# Patient Record
Sex: Female | Born: 1975
Health system: Southern US, Community
[De-identification: ages and names within clinical notes are randomized; demographics above are authoritative.]

## PROBLEM LIST (undated history)

## (undated) DIAGNOSIS — E559 Vitamin D deficiency, unspecified: Secondary | ICD-10-CM

## (undated) DIAGNOSIS — F418 Other specified anxiety disorders: Secondary | ICD-10-CM

## (undated) HISTORY — DX: Other specified anxiety disorders: F41.8

---
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
## 2004-08-26 ENCOUNTER — Inpatient Hospital Stay (HOSPITAL_COMMUNITY): Admission: AD | Admit: 2004-08-26 | Discharge: 2004-08-28 | Payer: Self-pay | Admitting: Obstetrics

## 2004-12-07 ENCOUNTER — Emergency Department (HOSPITAL_COMMUNITY): Admission: EM | Admit: 2004-12-07 | Discharge: 2004-12-07 | Payer: Self-pay | Admitting: Emergency Medicine

## 2005-02-27 ENCOUNTER — Emergency Department (HOSPITAL_COMMUNITY): Admission: EM | Admit: 2005-02-27 | Discharge: 2005-02-27 | Payer: Self-pay | Admitting: Emergency Medicine

## 2005-03-05 ENCOUNTER — Emergency Department (HOSPITAL_COMMUNITY): Admission: EM | Admit: 2005-03-05 | Discharge: 2005-03-05 | Payer: Self-pay | Admitting: Emergency Medicine

## 2007-04-16 ENCOUNTER — Emergency Department (HOSPITAL_COMMUNITY): Admission: EM | Admit: 2007-04-16 | Discharge: 2007-04-16 | Payer: Self-pay | Admitting: Emergency Medicine

## 2008-11-05 ENCOUNTER — Inpatient Hospital Stay (HOSPITAL_COMMUNITY): Admission: AD | Admit: 2008-11-05 | Discharge: 2008-11-05 | Payer: Self-pay | Admitting: Obstetrics & Gynecology

## 2009-01-31 ENCOUNTER — Ambulatory Visit (HOSPITAL_COMMUNITY): Admission: RE | Admit: 2009-01-31 | Discharge: 2009-01-31 | Payer: Self-pay | Admitting: Obstetrics & Gynecology

## 2009-06-06 ENCOUNTER — Inpatient Hospital Stay (HOSPITAL_COMMUNITY): Admission: AD | Admit: 2009-06-06 | Discharge: 2009-06-08 | Payer: Self-pay | Admitting: Obstetrics & Gynecology

## 2009-06-06 ENCOUNTER — Ambulatory Visit: Payer: Self-pay | Admitting: Physician Assistant

## 2010-02-01 ENCOUNTER — Encounter: Payer: Self-pay | Admitting: Emergency Medicine

## 2010-03-30 LAB — CBC
HCT: 32 % — ABNORMAL LOW (ref 36.0–46.0)
HCT: 33.5 % — ABNORMAL LOW (ref 36.0–46.0)
Hemoglobin: 11.2 g/dL — ABNORMAL LOW (ref 12.0–15.0)
Hemoglobin: 11.9 g/dL — ABNORMAL LOW (ref 12.0–15.0)
MCHC: 34.9 g/dL (ref 30.0–36.0)
MCHC: 35.4 g/dL (ref 30.0–36.0)
MCV: 98.5 fL (ref 78.0–100.0)
MCV: 99.3 fL (ref 78.0–100.0)
Platelets: 106 10*3/uL — ABNORMAL LOW (ref 150–400)
Platelets: 111 10*3/uL — ABNORMAL LOW (ref 150–400)
RBC: 3.23 MIL/uL — ABNORMAL LOW (ref 3.87–5.11)
RBC: 3.4 MIL/uL — ABNORMAL LOW (ref 3.87–5.11)
RDW: 14.3 % (ref 11.5–15.5)
RDW: 14.5 % (ref 11.5–15.5)
WBC: 11.3 10*3/uL — ABNORMAL HIGH (ref 4.0–10.5)
WBC: 6.1 10*3/uL (ref 4.0–10.5)

## 2010-03-30 LAB — RPR: RPR Ser Ql: NONREACTIVE

## 2010-04-16 LAB — GC/CHLAMYDIA PROBE AMP, GENITAL
Chlamydia, DNA Probe: NEGATIVE
GC Probe Amp, Genital: NEGATIVE

## 2010-04-16 LAB — CBC
HCT: 32.5 % — ABNORMAL LOW (ref 36.0–46.0)
Hemoglobin: 11.2 g/dL — ABNORMAL LOW (ref 12.0–15.0)
MCHC: 34.5 g/dL (ref 30.0–36.0)
MCV: 94.6 fL (ref 78.0–100.0)
Platelets: 220 10*3/uL (ref 150–400)
RBC: 3.44 MIL/uL — ABNORMAL LOW (ref 3.87–5.11)
RDW: 12.4 % (ref 11.5–15.5)
WBC: 5.6 10*3/uL (ref 4.0–10.5)

## 2010-04-16 LAB — ABO/RH: ABO/RH(D): O POS

## 2010-04-16 LAB — HCG, QUANTITATIVE, PREGNANCY: hCG, Beta Chain, Quant, S: 213637 m[IU]/mL — ABNORMAL HIGH (ref <5)

## 2010-04-16 LAB — WET PREP, GENITAL
Clue Cells Wet Prep HPF POC: NONE SEEN
Trich, Wet Prep: NONE SEEN
Yeast Wet Prep HPF POC: NONE SEEN

## 2010-10-06 LAB — URINE MICROSCOPIC-ADD ON

## 2010-10-06 LAB — COMPREHENSIVE METABOLIC PANEL
ALT: 11
AST: 22
Albumin: 3.3 — ABNORMAL LOW
Alkaline Phosphatase: 48
BUN: 10
CO2: 20
Calcium: 8.7
Chloride: 103
Creatinine, Ser: 0.8
GFR calc Af Amer: >60
GFR calc non Af Amer: >60
Glucose, Bld: 170 — ABNORMAL HIGH
Potassium: 2.8 — ABNORMAL LOW
Sodium: 135
Total Bilirubin: 0.3
Total Protein: 6.5

## 2010-10-06 LAB — URINALYSIS, ROUTINE W REFLEX MICROSCOPIC
Bilirubin Urine: NEGATIVE
Glucose, UA: NEGATIVE
Ketones, ur: 15 — AB
Nitrite: POSITIVE — AB
Protein, ur: NEGATIVE
Specific Gravity, Urine: 1.009
Urobilinogen, UA: 0.2
pH: 6

## 2010-10-06 LAB — CBC
HCT: 32.3 — ABNORMAL LOW
Hemoglobin: 11.4 — ABNORMAL LOW
MCHC: 35.3
MCV: 92.5
Platelets: 145 — ABNORMAL LOW
RBC: 3.49 — ABNORMAL LOW
RDW: 12.6
WBC: 10.1

## 2010-10-06 LAB — DIFFERENTIAL
Basophils Absolute: 0
Basophils Relative: 0
Eosinophils Absolute: 0
Eosinophils Relative: 0
Lymphocytes Relative: 3 — ABNORMAL LOW
Lymphs Abs: 0.3 — ABNORMAL LOW
Monocytes Absolute: 0.5
Monocytes Relative: 5
Neutro Abs: 9.4 — ABNORMAL HIGH
Neutrophils Relative %: 93 — ABNORMAL HIGH

## 2010-10-06 LAB — POCT PREGNANCY, URINE
Operator id: 26520
Preg Test, Ur: NEGATIVE

## 2010-10-06 LAB — URINE CULTURE: Colony Count: >=100000

## 2011-11-20 ENCOUNTER — Emergency Department (HOSPITAL_COMMUNITY)
Admission: EM | Admit: 2011-11-20 | Discharge: 2011-11-21 | Disposition: A | Discharge status: Home / Self Care | Payer: Self-pay | Attending: Emergency Medicine | Admitting: Emergency Medicine

## 2011-11-20 ENCOUNTER — Encounter (HOSPITAL_COMMUNITY): Payer: Self-pay

## 2011-11-20 DIAGNOSIS — R509 Fever, unspecified: Secondary | ICD-10-CM | POA: Insufficient documentation

## 2011-11-20 DIAGNOSIS — N12 Tubulo-interstitial nephritis, not specified as acute or chronic: Secondary | ICD-10-CM | POA: Insufficient documentation

## 2011-11-20 DIAGNOSIS — R110 Nausea: Secondary | ICD-10-CM | POA: Insufficient documentation

## 2011-11-20 MED ORDER — ACETAMINOPHEN 325 MG PO TABS: 650.0000 mg | ORAL_TABLET | Freq: Once | ORAL | Status: DC

## 2011-11-20 MED ORDER — ONDANSETRON HCL 4 MG/2ML IJ SOLN
4.0000 mg | Freq: Once | INTRAMUSCULAR | Status: AC
  Administered 2011-11-20: 4 mg via INTRAVENOUS
  Filled 2011-11-20: qty 2

## 2011-11-20 MED ORDER — SODIUM CHLORIDE 0.9 % IV BOLUS (SEPSIS)
1000.0000 mL | Freq: Once | INTRAVENOUS | Status: AC
  Administered 2011-11-20: 1000 mL via INTRAVENOUS

## 2011-11-20 MED ORDER — IBUPROFEN 800 MG PO TABS
800.0000 mg | ORAL_TABLET | Freq: Once | ORAL | Status: AC
  Administered 2011-11-20: 800 mg via ORAL
  Filled 2011-11-20: qty 1

## 2011-11-20 MED ORDER — FENTANYL CITRATE 0.05 MG/ML IJ SOLN
50.0000 ug | Freq: Once | INTRAMUSCULAR | Status: AC
  Administered 2011-11-20: 50 ug via INTRAVENOUS
  Filled 2011-11-20: qty 2

## 2011-11-20 NOTE — ED Notes (Signed)
Per Spouse, pt started with left flank pain yesterday.  Today having fever and chills. No trouble urinating.  Pt is nauseated with no vomiting.  No burning with urination.

## 2011-11-21 LAB — CBC WITH DIFFERENTIAL/PLATELET
Basophils Absolute: 0 10*3/uL (ref 0.0–0.1)
Basophils Relative: 0 % (ref 0–1)
Eosinophils Absolute: 0 10*3/uL (ref 0.0–0.7)
MCH: 30.7 pg (ref 26.0–34.0)
MCHC: 34.7 g/dL (ref 30.0–36.0)
Neutrophils Relative %: 95 % — ABNORMAL HIGH (ref 43–77)
Platelets: 153 10*3/uL (ref 150–400)
RBC: 3.68 MIL/uL — ABNORMAL LOW (ref 3.87–5.11)
RDW: 12.6 % (ref 11.5–15.5)

## 2011-11-21 LAB — URINALYSIS, ROUTINE W REFLEX MICROSCOPIC
Bilirubin Urine: NEGATIVE
Ketones, ur: NEGATIVE mg/dL
Nitrite: POSITIVE — AB
Protein, ur: 100 mg/dL — AB
Specific Gravity, Urine: 1.02 (ref 1.005–1.030)
Urobilinogen, UA: 1 mg/dL (ref 0.0–1.0)

## 2011-11-21 LAB — BASIC METABOLIC PANEL
GFR calc Af Amer: >90 mL/min (ref >90)
GFR calc non Af Amer: >90 mL/min (ref >90)
Potassium: 3.1 mEq/L — ABNORMAL LOW (ref 3.5–5.1)
Sodium: 136 mEq/L (ref 135–145)

## 2011-11-21 LAB — URINE MICROSCOPIC-ADD ON

## 2011-11-21 MED ORDER — OXYCODONE-ACETAMINOPHEN 5-325 MG PO TABS
2.0000 | ORAL_TABLET | Freq: Once | ORAL | Status: AC
  Administered 2011-11-21: 2 via ORAL
  Filled 2011-11-21: qty 2

## 2011-11-21 MED ORDER — ONDANSETRON HCL 4 MG PO TABS: 4.0000 mg | ORAL_TABLET | Freq: Four times a day (QID) | ORAL | Status: DC

## 2011-11-21 MED ORDER — CIPROFLOXACIN HCL 500 MG PO TABS
500.0000 mg | ORAL_TABLET | Freq: Two times a day (BID) | ORAL | Status: DC
Start: 2011-11-21 — End: 2012-10-02

## 2011-11-21 MED ORDER — DEXTROSE 5 % IV SOLN
1.0000 g | Freq: Once | INTRAVENOUS | Status: AC
  Administered 2011-11-21: 1 g via INTRAVENOUS
  Filled 2011-11-21: qty 10

## 2011-11-21 MED ORDER — IBUPROFEN 800 MG PO TABS: 800.0000 mg | ORAL_TABLET | Freq: Three times a day (TID) | ORAL | Status: DC

## 2011-11-21 MED ORDER — CIPROFLOXACIN IN D5W 400 MG/200ML IV SOLN
400.0000 mg | Freq: Two times a day (BID) | INTRAVENOUS | Status: DC
  Administered 2011-11-21: 400 mg via INTRAVENOUS
  Filled 2011-11-21: qty 200

## 2011-11-21 MED ORDER — POTASSIUM CHLORIDE CRYS ER 20 MEQ PO TBCR
40.0000 meq | EXTENDED_RELEASE_TABLET | Freq: Once | ORAL | Status: AC
  Administered 2011-11-21: 40 meq via ORAL
  Filled 2011-11-21: qty 2

## 2011-11-21 NOTE — ED Provider Notes (Signed)
History     CSN: [id number]  Arrival date & time 11/20/11  2248   First MD Initiated Contact with Patient 11/20/11 2305      Chief Complaint  Patient presents with  . Flank Pain    (Consider location/radiation/quality/duration/timing/severity/associated sxs/prior treatment) HPI 36 year old Hispanic female presents to emergency apartment complaining of left flank pain, fever, nausea without vomiting. Patient hasn't reported onset of symptoms yesterday. She denies any urinary symptoms, no vaginal discharge. Last cycle was 2 weeks ago. No prior history of bladder infection pyelonephritis or kidney stone. She's had no vomiting. No sick contacts. No rashes, no injury.  No past medical history on file.  No past surgical history on file.  No family history on file.  History  Substance Use Topics  . Smoking status: Never Smoker   . Smokeless tobacco: Not on file  . Alcohol Use: Yes     Comment: social    OB History    Grav Para Term Preterm Abortions TAB SAB Ect Mult Living                  Review of Systems  Unable to perform ROS: Other   language barrier  Allergies  Review of patient's allergies indicates no known allergies.  Home Medications   Current Outpatient Rx  Name  Route  Sig  Dispense  Refill  . ACETAMINOPHEN 325 MG PO TABS   Oral   Take 650 mg by mouth every 6 (six) hours as needed. For pain         . CIPROFLOXACIN HCL 500 MG PO TABS   Oral   Take 1 tablet (500 mg total) by mouth every 12 (twelve) hours.   14 tablet   0   . IBUPROFEN 800 MG PO TABS   Oral   Take 1 tablet (800 mg total) by mouth 3 (three) times daily.   21 tablet   0   . ONDANSETRON HCL 4 MG PO TABS   Oral   Take 1 tablet (4 mg total) by mouth every 6 (six) hours. PRN nausea   12 tablet   0     BP 98/48  Pulse 92  Temp 98.2 F (36.8 C) (Oral)  Resp 16  SpO2 100%  LMP 11/07/2011  Physical Exam  Nursing note and vitals reviewed. Constitutional: She is oriented to  person, place, and time. She appears well-developed and well-nourished. She appears distressed (ill-appearing, uncomfortable).  HENT:  Head: Normocephalic and atraumatic.  Nose: Nose normal.  Mouth/Throat: Oropharynx is clear and moist.  Eyes: Conjunctivae normal and EOM are normal. Pupils are equal, round, and reactive to light.  Neck: Normal range of motion. Neck supple. No JVD present. No tracheal deviation present. No thyromegaly present.  Cardiovascular: Normal rate, regular rhythm, normal heart sounds and intact distal pulses.  Exam reveals no gallop and no friction rub.   No murmur heard. Pulmonary/Chest: Effort normal and breath sounds normal. No stridor. No respiratory distress. She has no wheezes. She has no rales. She exhibits no tenderness.  Abdominal: Soft. Bowel sounds are normal. She exhibits no distension and no mass. There is tenderness (tenderness in epigastrium and left CVA area). There is no rebound and no guarding.  Musculoskeletal: Normal range of motion. She exhibits no edema and no tenderness.  Lymphadenopathy:    She has no cervical adenopathy.  Neurological: She is alert and oriented to person, place, and time. She exhibits normal muscle tone. Coordination normal.  Skin: Skin is warm  and dry. No rash noted. No erythema. No pallor.  Psychiatric: She has a normal mood and affect. Her behavior is normal. Judgment and thought content normal.    ED Course  Procedures (including critical care time)  Labs Reviewed  URINALYSIS, ROUTINE W REFLEX MICROSCOPIC - Abnormal; Notable for the following:    APPearance CLOUDY (*)     Hgb urine dipstick LARGE (*)     Protein, ur 100 (*)     Nitrite POSITIVE (*)     Leukocytes, UA LARGE (*)     All other components within normal limits  CBC WITH DIFFERENTIAL - Abnormal; Notable for the following:    RBC 3.68 (*)     Hemoglobin 11.3 (*)     HCT 32.6 (*)     Neutrophils Relative 95 (*)     Lymphocytes Relative 4 (*)     Lymphs  Abs 0.2 (*)     Monocytes Relative 0 (*)     Monocytes Absolute 0.0 (*)     All other components within normal limits  BASIC METABOLIC PANEL - Abnormal; Notable for the following:    Potassium 3.1 (*)     Glucose, Bld 113 (*)     All other components within normal limits  URINE MICROSCOPIC-ADD ON - Abnormal; Notable for the following:    Bacteria, UA MANY (*)     All other components within normal limits  PREGNANCY, URINE  URINE CULTURE   No results found.   1. Pyelonephritis       MDM  72-year-old female with left-sided power nephritis given results of her urinalysis. Patient initially looks unwell, was tachycardic and febrile. Patient feeling much better after fluids, ibuprofen, pain medicines. Have given IV dosing of Cipro and Rocephin. Will discharge home on Cipro. Patient and husband able to understand my instructions for reasons for return. They have been given instructions in Spanish as well as [place].       Olivia Mackie, MD 11/21/11 [phone]

## 2011-11-23 LAB — URINE CULTURE

## 2011-11-24 NOTE — ED Notes (Signed)
+   Urine Patient treated with Cipro-sensitive to same-chart appended per protocol MD. 

## 2012-06-27 ENCOUNTER — Emergency Department (HOSPITAL_COMMUNITY): Payer: Self-pay

## 2012-06-27 ENCOUNTER — Emergency Department (HOSPITAL_COMMUNITY)
Admission: EM | Admit: 2012-06-27 | Discharge: 2012-06-27 | Disposition: A | Discharge status: Home / Self Care | Payer: Self-pay | Attending: Emergency Medicine | Admitting: Emergency Medicine

## 2012-06-27 ENCOUNTER — Encounter (HOSPITAL_COMMUNITY): Payer: Self-pay | Admitting: Emergency Medicine

## 2012-06-27 DIAGNOSIS — R296 Repeated falls: Secondary | ICD-10-CM | POA: Insufficient documentation

## 2012-06-27 DIAGNOSIS — X500XXA Overexertion from strenuous movement or load, initial encounter: Secondary | ICD-10-CM | POA: Insufficient documentation

## 2012-06-27 DIAGNOSIS — S93409A Sprain of unspecified ligament of unspecified ankle, initial encounter: Secondary | ICD-10-CM | POA: Insufficient documentation

## 2012-06-27 DIAGNOSIS — Y939 Activity, unspecified: Secondary | ICD-10-CM | POA: Insufficient documentation

## 2012-06-27 DIAGNOSIS — Y929 Unspecified place or not applicable: Secondary | ICD-10-CM | POA: Insufficient documentation

## 2012-06-27 MED ORDER — IBUPROFEN 800 MG PO TABS: 800.0000 mg | ORAL_TABLET | Freq: Three times a day (TID) | ORAL | Status: DC

## 2012-06-27 MED ORDER — TRAMADOL HCL 50 MG PO TABS: 50.0000 mg | ORAL_TABLET | Freq: Four times a day (QID) | ORAL | Status: DC | PRN

## 2012-06-27 NOTE — Progress Notes (Deleted)
[facility] has seen patient and provided her with a list of primary care resources, as well as, a oc application. ° °

## 2012-06-27 NOTE — ED Notes (Signed)
Ortho tech called 

## 2012-06-27 NOTE — ED Provider Notes (Signed)
History     CSN: [id number]  Arrival date & time 06/27/12  0745   First MD Initiated Contact with Patient 06/27/12 [phone]      Chief Complaint  Patient presents with  . Joint Swelling    (Consider location/radiation/quality/duration/timing/severity/associated sxs/prior treatment) HPI Comments: Patient presents for evaluation of continued pain and swelling of the right foot and ankle. Patient reports that she fell, twisting her ankle about a week ago. She is able to walk on it, but it is still very swollen and there is bruising of the foot. Pain is moderate, worsens with walking.   No past medical history on file.  No past surgical history on file.  No family history on file.  History  Substance Use Topics  . Smoking status: Never Smoker   . Smokeless tobacco: Not on file  . Alcohol Use: Yes     Comment: social    OB History   Grav Para Term Preterm Abortions TAB SAB Ect Mult Living                  Review of Systems  Musculoskeletal: Positive for arthralgias.    Allergies  Review of patient's allergies indicates no known allergies.  Home Medications   Current Outpatient Rx  Name  Route  Sig  Dispense  Refill  . acetaminophen (TYLENOL) 325 MG tablet   Oral   Take 650 mg by mouth every 6 (six) hours as needed. For pain         . ciprofloxacin (CIPRO) 500 MG tablet   Oral   Take 1 tablet (500 mg total) by mouth every 12 (twelve) hours.   14 tablet   0   . ibuprofen (ADVIL,MOTRIN) 800 MG tablet   Oral   Take 1 tablet (800 mg total) by mouth 3 (three) times daily.   21 tablet   0   . ondansetron (ZOFRAN) 4 MG tablet   Oral   Take 1 tablet (4 mg total) by mouth every 6 (six) hours. PRN nausea   12 tablet   0     BP 114/55  Pulse 75  Temp(Src) 98.8 F (37.1 C) (Oral)  Resp 18  SpO2 100%  Physical Exam  Musculoskeletal:       Right ankle: She exhibits swelling. She exhibits no deformity. Tenderness. Lateral malleolus and medial malleolus  tenderness found.       Right foot: She exhibits swelling.  Skin: Ecchymosis noted.       ED Course  Procedures (including critical care time)  Labs Reviewed - No data to display Dg Ankle Complete Right  06/27/2012   *RADIOLOGY REPORT*  Clinical Data: Injured right ankle.  RIGHT ANKLE - COMPLETE 3+ VIEW  Comparison: None.  Findings: The ankle mortise is maintained.  No acute ankle fracture.  The visualized mid and hind foot bony structures are intact.  IMPRESSION: No acute fracture.   Original Report Authenticated By: Rudie Meyer, M.D.   Dg Foot Complete Right  06/27/2012   *RADIOLOGY REPORT*  Clinical Data: Right foot pain and swelling.  Fell.  RIGHT FOOT COMPLETE - 3+ VIEW  Comparison: None  Findings: The joint spaces are maintained.  No acute fracture.  IMPRESSION: No acute bony findings.   Original Report Authenticated By: Rudie Meyer, M.D.     Diagnosis: Ankle sprain    MDM  Patient has pain and swelling of the right foot and ankle after a fall. X-ray was negative.  Gilda Crease, MD 06/28/12 [phone]

## 2012-06-27 NOTE — ED Notes (Signed)
Patient transported to X-ray 

## 2012-06-27 NOTE — Progress Notes (Signed)
[facility] has seen patient and provided her with a list of primary care doctors, as well as, a OC application.

## 2012-06-27 NOTE — ED Notes (Signed)
Pt states she fell last week and has since had R ankle pain and swelling.

## 2012-10-02 ENCOUNTER — Ambulatory Visit (INDEPENDENT_AMBULATORY_CARE_PROVIDER_SITE_OTHER): Payer: Self-pay | Admitting: Emergency Medicine

## 2012-10-02 VITALS — BP 118/82 | HR 126 | Temp 103.0°F | Resp 18 | Ht 62.5 in | Wt 111.0 lb

## 2012-10-02 DIAGNOSIS — R509 Fever, unspecified: Secondary | ICD-10-CM

## 2012-10-02 LAB — POCT URINALYSIS DIPSTICK
Bilirubin, UA: NEGATIVE
Ketones, UA: NEGATIVE
Leukocytes, UA: NEGATIVE
Protein, UA: 100
Spec Grav, UA: 1.025

## 2012-10-02 LAB — POCT UA - MICROSCOPIC ONLY
Bacteria, U Microscopic: NEGATIVE
Yeast, UA: NEGATIVE

## 2012-10-02 LAB — POCT SEDIMENTATION RATE: POCT SED RATE: 63 mm/hr — AB (ref 0–22)

## 2012-10-02 LAB — POCT CBC
Granulocyte percent: 81.3 %G — AB (ref 37–80)
HCT, POC: 34.6 % — AB (ref 37.7–47.9)
Hemoglobin: 10.7 g/dL — AB (ref 12.2–16.2)
Lymph, poc: 1.2 (ref 0.6–3.4)
MCHC: 30.9 g/dL — AB (ref 31.8–35.4)
MCV: 93 fL (ref 80–97)
POC LYMPH PERCENT: 12.7 %L (ref 10–50)
RDW, POC: 15 %

## 2012-10-02 NOTE — Progress Notes (Signed)
[facility] [address] [place] [address] [phone]  Date:  10/02/2012   Name:  Pamela Wilkinson   DOB:  Jun 01, 1975   MRN:  [id number]  PCP:  No PCP Per Patient    Chief Complaint: Fever, Dizziness, Abdominal Pain, Headache and Exposure to STD   History of Present Illness:  Pamela Wilkinson is a 37 y.o. very pleasant female patient who presents with the following:  Ill for five months with illness related to the onset of her menses.  Has fever and chills.  No nausea or vomiting.  No dysuria, urgency or discharge.  No cough or wheezing or shortness of breath.  No coryza or sore throat.  Does have headache.  No improvement with over the counter medications or other home remedies. Denies other complaint or health concern today.   There are no active problems to display for this patient.   No past medical history on file.  No past surgical history on file.  History  Substance Use Topics  . Smoking status: Never Smoker   . Smokeless tobacco: Not on file  . Alcohol Use: Yes     Comment: social    No family history on file.  No Known Allergies  Medication list has been reviewed and updated.  Current Outpatient Prescriptions on File Prior to Visit  Medication Sig Dispense Refill  . acetaminophen (TYLENOL) 325 MG tablet Take 650 mg by mouth every 6 (six) hours as needed. For pain      . ibuprofen (ADVIL,MOTRIN) 800 MG tablet Take 1 tablet (800 mg total) by mouth 3 (three) times daily.  21 tablet  0   No current facility-administered medications on file prior to visit.    Review of Systems:  As per HPI, otherwise negative.    Physical Examination: Filed Vitals:   10/02/12 1222  BP: 118/82  Pulse: 126  Temp: 103 F (39.4 C)  Resp: 18   Filed Vitals:   10/02/12 1222  Height: 5' 2.5" (1.588 m)  Weight: 111 lb (50.349 kg)   Body mass index is 19.97 kg/(m^2). Ideal Body Weight: Weight in (lb) to have BMI = 25:  138.6  GEN: WDWN, NAD, Non-toxic, A & O x 3 HEENT: Atraumatic, Normocephalic. Neck supple. No masses, No LAD. Ears and Nose: No external deformity. CV: RRR, No M/G/R. No JVD. No thrill. No extra heart sounds. PULM: CTA B, no wheezes, crackles, rhonchi. No retractions. No resp. distress. No accessory muscle use. ABD: S, NT, ND, +BS. No rebound. No HSM. EXTR: No c/c/e NEURO Normal gait.  PSYCH: Normally interactive. Conversant. Not depressed or anxious appearing.  Calm demeanor.    Assessment and Plan: Febrile illness Recheck on temp at 1415 was 100.1 Signed,  Phillips Odor, MD   Results for orders placed in visit on 10/02/12  POCT CBC      Result Value Range   WBC 9.6  4.6 - 10.2 K/uL   Lymph, poc 1.2  0.6 - 3.4   POC LYMPH PERCENT 12.7  10 - 50 %L   MID (cbc) 0.6  0 - 0.9   POC MID % 6.0  0 - 12 %M   POC Granulocyte 7.8 (*) 2 - 6.9   Granulocyte percent 81.3 (*) 37 - 80 %G   RBC 3.72 (*) 4.04 - 5.48 M/uL   Hemoglobin 10.7 (*) 12.2 - 16.2 g/dL   HCT, POC 08.6 (*) 57.8 - 47.9 %   MCV 93.0  80 -  97 fL   MCH, POC 28.8  27 - 31.2 pg   MCHC 30.9 (*) 31.8 - 35.4 g/dL   RDW, POC 45.4     Platelet Count, POC 212  142 - 424 K/uL   MPV 7.5  0 - 99.8 fL  POCT URINALYSIS DIPSTICK      Result Value Range   Color, UA amber     Clarity, UA cloudy     Glucose, UA neg     Bilirubin, UA neg     Ketones, UA neg     Spec Grav, UA 1.025     Blood, UA large     pH, UA 6.0     Protein, UA 100     Urobilinogen, UA 1.0     Nitrite, UA neg     Leukocytes, UA Negative    POCT UA - MICROSCOPIC ONLY      Result Value Range   WBC, Ur, HPF, POC 4-8     RBC, urine, microscopic tntc     Bacteria, U Microscopic neg     Mucus, UA small     Epithelial cells, urine per micros 3-6     Crystals, Ur, HPF, POC neg     Casts, Ur, LPF, POC neg     Yeast, UA neg

## 2012-10-04 LAB — URINE CULTURE
Colony Count: NO GROWTH
Organism ID, Bacteria: NO GROWTH

## 2012-10-07 NOTE — Addendum Note (Signed)
Addended by: Carmelina Dane on: 10/07/2012 10:27 AM   Modules accepted: Orders

## 2012-10-12 ENCOUNTER — Ambulatory Visit: Payer: Self-pay | Attending: Internal Medicine

## 2012-11-06 ENCOUNTER — Other Ambulatory Visit (HOSPITAL_COMMUNITY)
Admission: RE | Admit: 2012-11-06 | Discharge: 2012-11-06 | Disposition: A | Discharge status: Home / Self Care | Payer: No Typology Code available for payment source | Source: Ambulatory Visit | Attending: Internal Medicine | Admitting: Internal Medicine

## 2012-11-06 ENCOUNTER — Ambulatory Visit: Payer: No Typology Code available for payment source | Attending: Internal Medicine | Admitting: Internal Medicine

## 2012-11-06 ENCOUNTER — Encounter: Payer: Self-pay | Admitting: Internal Medicine

## 2012-11-06 VITALS — BP 108/71 | HR 70 | Temp 98.9°F | Resp 16 | Ht 63.0 in | Wt 110.0 lb

## 2012-11-06 DIAGNOSIS — Z1151 Encounter for screening for human papillomavirus (HPV): Secondary | ICD-10-CM | POA: Insufficient documentation

## 2012-11-06 DIAGNOSIS — Z01419 Encounter for gynecological examination (general) (routine) without abnormal findings: Secondary | ICD-10-CM | POA: Insufficient documentation

## 2012-11-06 DIAGNOSIS — Z124 Encounter for screening for malignant neoplasm of cervix: Secondary | ICD-10-CM

## 2012-11-06 DIAGNOSIS — N644 Mastodynia: Secondary | ICD-10-CM | POA: Insufficient documentation

## 2012-11-06 LAB — CBC WITH DIFFERENTIAL/PLATELET
Basophils Absolute: 0 10*3/uL (ref 0.0–0.1)
Basophils Relative: 1 % (ref 0–1)
Eosinophils Relative: 2 % (ref 0–5)
HCT: 34.6 % — ABNORMAL LOW (ref 36.0–46.0)
Hemoglobin: 11.6 g/dL — ABNORMAL LOW (ref 12.0–15.0)
MCHC: 33.5 g/dL (ref 30.0–36.0)
MCV: 86.1 fL (ref 78.0–100.0)
Monocytes Absolute: 0.4 10*3/uL (ref 0.1–1.0)
Monocytes Relative: 7 % (ref 3–12)
Neutro Abs: 3.1 10*3/uL (ref 1.7–7.7)
RDW: 15.6 % — ABNORMAL HIGH (ref 11.5–15.5)

## 2012-11-06 LAB — CMP AND LIVER
ALT: <8 U/L (ref 0–35)
AST: 12 U/L (ref 0–37)
Albumin: 4.8 g/dL (ref 3.5–5.2)
Alkaline Phosphatase: 45 U/L (ref 39–117)
Indirect Bilirubin: 0.3 mg/dL (ref 0.0–0.9)
Potassium: 3.8 mEq/L (ref 3.5–5.3)
Sodium: 136 mEq/L (ref 135–145)
Total Bilirubin: 0.4 mg/dL (ref 0.3–1.2)
Total Protein: 7.5 g/dL (ref 6.0–8.3)

## 2012-11-06 LAB — TSH: TSH: 1.57 u[IU]/mL (ref 0.350–4.500)

## 2012-11-06 NOTE — Patient Instructions (Signed)
Sndrome premenstrual  (Premenstrual Syndrome) El sndrome premenstrual es un trastorno que consiste en sntomas fsicos, emocionales y de comportamiento que afectan a las mujeres en edad frtil. Se produce entre 5 y [address] antes del inicio de un perodo menstrual y se repite con un patrn predecible. Los sntomas desaparecen poco despus del comienzo del perodo menstrual. Puede interferir de [employer] con las actividades diarias normales y los sntomas pueden variar de leves a graves. Cuando es grave, puede diagnosticarse como trastorno disfrico premenstrual. Un pequeo porcentaje de mujeres se ven afectadas por los sntomas del sndrome premenstrual y un porcentaje an Adult nurse de las mujeres se ven afectados por el sndrome disfrico premenstrual.  CAUSAS  La causa exacta del sndrome premenstrual es desconocida, pero parece estar relacionado con cambios hormonales cclicos que ocurren antes de Tax adviser. Estas hormonas afectan ciertas sustancias qumicas del cerebro (serotonina) lo que puede influir en el estado de nimo de Medical laboratory scientific officer.  SNTOMAS  Los sntomas del sndrome premenstrual se repiten constantemente de mes a mes y desaparecen por completo despus del comienzo del perodo menstrual. El sntoma emocional o de comportamiento ms frecuente es el cambio de humor. Estos cambios de humor pueden ser discapacitantes e interferir con las actividades normales de la vida diaria. Otros sntomas comunes son la depresin y los ataques de ira. Otros sntomas son:   Irritabilidad.  Ansiedad.  Ataques de llanto.   Los antojos de comida o cambios del apetito.   Los [employer] deseo sexual.   Confusin.   Agresin.   Aislamiento social.   Falta de concentracin. Los sntomas fsicos ms comunes son la sensacin de hinchazn, dolor en los senos, dolor de [place] y fatiga extrema. Otros sntomas fsicos pueden ser:   Dolor de espalda.   Hinchazn de manos y pies.  Aumento  de [place].   Acaloramiento.  DIAGNSTICO  Para hacer un diagnstico, el mdico le har preguntas para confirmar si tiene un patrn de sntomas. Los sntomas deben:   Estar presentes 5 das antes del inicio de su perodo y Research officer, trade union por lo menos 3 meses consecutivos.   Finalizar 4 das despus de que comienza el perodo.   Interferir con algunas de sus actividades normales.  Deben descartarse otras enfermedades, como la enfermedad tiroidea, depresin y dolores de [place], antes de Pharmacist, hospital diagnstico.  [place]  El mdico puede sugerirle un estilo de vida saludable, como la [place] fsica. Los analgsicos de venta libre pueden [employer] clicos, los dolores, la cefalea y la sensibilidad en los senos. Sin embargo, los inhibidores selectivos de la recaptacin de serotonina (ISRS) son ms beneficiosos para mejorar el sndrome premenstrual si se toman en la segunda mitad del ciclo menstrual. Se pueden tomar diariamente. La pldora anticonceptiva oral ms eficaz utilizada para los sntomas del sndrome premenstrual es la que contiene drospirenona. Dejar de Engineering geologist por 4 Water engineer de los 7 das habituales tambin ha demostrado ser [place].  Hay una serie de medicamentos, suplementos dietticos, vitaminas y comprimidos para Geographical information systems officer (diurticos) que se consideran de [place], pero no han demostrado ser beneficiosos para mejorar estos sntomas.  INSTRUCCIONES PARA EL CUIDADO EN EL HOGAR   Durante 2 o 3 meses escriba sus sntomas, su gravedad y cunto duran. Esto ayudar a su mdico a recetarle el mejor tratamiento para los sntomas.  Practique actividad fsica con regularidad, segn le aconseje el profesional que la asiste.  Consuma una dieta normal y bien balanceada.  Limite o elimine el consumo  de cafena, el alcohol y el tabaco.  Limite el consumo de alimentos salados para disminuir la hinchazn y la retencin de lquidos.  Duerma lo suficiente. Utilice tcnicas de  relajacin.  Debe ingerir gran cantidad de lquido para mantener la orina de tono claro o color amarillo plido.  Tome todos los medicamentos segn le indic su mdico.  Limite las situaciones de estrs.  Tome un multivitamnico segn le indic su mdico. Document Released: 10/07/2004 Document Revised: 09/22/2011 [facility] Patient Information 2014 [place], [place]. Prueba de Papanicolaou  (Pap Test)  La prueba de Papanicolaou estudia las clulas en la superficie del cuello del tero. Su mdico buscar cambios celulares anormales, una infeccin o clulas cancerosas. Se llama displasia cuando las clulas no son normales. La displasia puede convertirse en cncer. Son importantes las pruebas regulares de Papanicolaou para detener el desarrollo del cncer. ANTES DEL PROCEDIMIENTO   Pregntele a su mdico cundo programar su prueba de Papanicolaou. Puede ser importante que programe la prueba lejos del momento del perodo.  No use duchas vaginales ni tenga sexo Marine scientist) durante las 24 horas anteriores al procedimiento.  No use cremas vaginales ni tampones durante las 24 horas antes de la prueba.  Debe hacer pis orinar justo antes de la prueba. PROCEDIMIENTO   Deber Arsenio Loader en una camilla con los pies en los estribos.  Insertarn en la vagina un instrumento tibio metlico o de plstico (espculo) para Restaurant manager, fast food.  El mdico usar un pequeo cepillo de plstico o esptula de [place] para tomar clulas del cuello uterino.  Las clulas se colocan en un recipiente de laboratorio.  Luego se examinan en el microscopio para determinar si son normales o no. DESPUS DEL PROCEDIMIENTO  Retire los [employer] prueba. Si son anormales, puede ser que Financial risk analyst ms [employer].  Document Released: 04/14/2010 Document Revised: 03/22/2011 [facility] Patient Information 2014 [place], [place].

## 2012-11-06 NOTE — Progress Notes (Signed)
Pt is here to establish care. Pt is requesting a pap smear, physical and lab work. For three months pt is having headaches and stomach aches. When she is on her menstrual cycle she gets fevers and chills. Pt states that after she a her baby 3 years ago her lower back and hips hurt sometimes. She said that for a year and a half her right leg feels unsteady as if she will fall.

## 2012-11-06 NOTE — Progress Notes (Signed)
Patient ID: Pamela Wilkinson, female   DOB: [birth year], 37 y.o.   MRN: [id number] Patient Demographics  Pamela Wilkinson, is a 37 y.o. female  [id number]  [id number]  DOB - Apr 07, 1975  CC:  Chief Complaint  Patient presents with  . Establish Care       HPI: Pamela Wilkinson is a 37 y.o. female here today to establish medical care. She has no significant past medical history. She is here today for Pap smear. Not on any chronic medication. No family history of cancer. She claims to be doing well at home, last menstrual period was last week Thursday, last child birth was 3 years ago. Was recently seen and worked up for low abdominal pain, treated for UTI and subsequent tests showed clean urine, PCR test were negative for chlamydia and gonorrhea. She does not smoke cigarette, she does not drink alcohol. Patient has No headache, No chest pain, No abdominal pain - No Nausea, No new weakness tingling or numbness, No Cough - SOB.  No Known Allergies History reviewed. No pertinent past medical history. Current Outpatient Prescriptions on File Prior to Visit  Medication Sig Dispense Refill  . acetaminophen (TYLENOL) 325 MG tablet Take 650 mg by mouth every 6 (six) hours as needed. For pain      . ibuprofen (ADVIL,MOTRIN) 800 MG tablet Take 1 tablet (800 mg total) by mouth 3 (three) times daily.  21 tablet  0   No current facility-administered medications on file prior to visit.   Family History  Problem Relation Age of Onset  . Hypertension Mother   . Heart disease Maternal Aunt    History   Social History  . Marital Status: Married    Spouse Name: N/A    Number of Children: N/A  . Years of Education: N/A   Occupational History  . Not on file.   Social History Main Topics  . Smoking status: Never Smoker   . Smokeless tobacco: Not on file  . Alcohol Use: Yes     Comment: social  . Drug Use: No  . Sexual Activity: Yes    Birth Control/ Protection:  None   Other Topics Concern  . Not on file   Social History Narrative  . No narrative on file    Review of Systems: Constitutional: Negative for fever, chills, diaphoresis, activity change, appetite change and fatigue. HENT: Negative for ear pain, nosebleeds, congestion, facial swelling, rhinorrhea, neck pain, neck stiffness and ear discharge.  Eyes: Negative for pain, discharge, redness, itching and visual disturbance. Respiratory: Negative for cough, choking, chest tightness, shortness of breath, wheezing and stridor.  Cardiovascular: Negative for chest pain, palpitations and leg swelling. Gastrointestinal: Negative for abdominal distention. Genitourinary: Negative for dysuria, urgency, frequency, hematuria, flank pain, decreased urine volume, difficulty urinating and dyspareunia.  Musculoskeletal: Negative for back pain, joint swelling, arthralgia and gait problem. Neurological: Negative for dizziness, tremors, seizures, syncope, facial asymmetry, speech difficulty, weakness, light-headedness, numbness and headaches.  Hematological: Negative for adenopathy. Does not bruise/bleed easily. Psychiatric/Behavioral: Negative for hallucinations, behavioral problems, confusion, dysphoric mood, decreased concentration and agitation.    Objective:   Filed Vitals:   11/06/12 1441  BP: 108/71  Pulse: 70  Temp: 98.9 F (37.2 C)  Resp: 16    Physical Exam: Constitutional: Patient appears well-developed and well-nourished. No distress. HENT: Normocephalic, atraumatic, External right and left ear normal. Oropharynx is clear and moist.  Eyes: Conjunctivae and EOM are normal. PERRLA, no scleral icterus. Neck: Normal ROM. Neck supple. No JVD. No  tracheal deviation. No thyromegaly. CVS: RRR, S1/S2 +, no murmurs, no gallops, no carotid bruit.  Pulmonary: Effort and breath sounds normal, no stridor, rhonchi, wheezes, rales.  Abdominal: Soft. BS +, no distension, tenderness, rebound or guarding.   Musculoskeletal: Normal range of motion. No edema and no tenderness.  Lymphadenopathy: No lymphadenopathy noted, cervical, inguinal or axillary Neuro: Alert. Normal reflexes, muscle tone coordination. No cranial nerve deficit. Skin: Skin is warm and dry. No rash noted. Not diaphoretic. No erythema. No pallor. Psychiatric: Normal mood and affect. Behavior, judgment, thought content normal. Pelvic exam: Normal female external genitalia, central cervix, negative cervical motion tenderness, no unusual discharge or odor. No mass felt on bimanual palpation  Lab Results  Component Value Date   WBC 9.6 10/02/2012   HGB 10.7* 10/02/2012   HCT 34.6* 10/02/2012   MCV 93.0 10/02/2012   PLT 153 11/20/2011   Lab Results  Component Value Date   CREATININE 0.74 11/20/2011   BUN 13 11/20/2011   NA 136 11/20/2011   K 3.1* 11/20/2011   CL 104 11/20/2011   CO2 21 11/20/2011    No results found for this basename: HGBA1C   Lipid Panel  No results found for this basename: chol, trig, hdl, cholhdl, vldl, ldlcalc       Assessment and plan:   Patient Active Problem List   Diagnosis Date Noted  . Pap smear for cervical cancer screening 11/06/2012  . Nipple pain 11/06/2012    Plan: Comprehensive metabolic panel Thyroid function test Complete blood count and differentials  Pap smear done, sent for cytology  Patient has been extensively counseled about nutrition and exercise Ibuprofen when necessary for pain     Health Maintenance -Colonoscopy: Not applicable -Pap Smear: Done today and sent for cytology -Mammogram: Not applicable -Vaccinations:  -Influenza decline  Follow up in 3 months or when necessary  Interpreter was used to communicate directly with patient for the entire encounter including providing detailed patient instructions.   The patient was given clear instructions to go to ER or return to medical center if symptoms don't improve, worsen or new problems develop. The patient  verbalized understanding. The patient was told to call to get lab results if they haven't heard anything in the next week.     Jeanann Lewandowsky, MD, MHA, FACP, FAAP [facility] [place], [place] [phone]   11/06/2012, 2:56 PM

## 2012-11-07 ENCOUNTER — Telehealth: Payer: Self-pay | Admitting: Emergency Medicine

## 2012-11-07 NOTE — Telephone Encounter (Signed)
Message copied by Darlis Loan on Tue Nov 07, 2012 10:23 AM ------      Message from: Quentin Angst      Created: Tue Nov 07, 2012  9:45 AM       Please inform patient that the lab results are mostly normal except for slightly low hemoglobin, she can't take over-the-counter iron supplements we'll repeat these in about 6 months. We are still within her  smear result ------

## 2012-11-07 NOTE — Telephone Encounter (Signed)
Pt given test results per husband interpretor for Spanish.

## 2013-02-06 ENCOUNTER — Encounter: Payer: Self-pay | Admitting: Internal Medicine

## 2013-02-06 ENCOUNTER — Ambulatory Visit: Payer: No Typology Code available for payment source | Attending: Internal Medicine | Admitting: Internal Medicine

## 2013-02-06 VITALS — BP 111/69 | HR 75 | Temp 98.2°F | Resp 16 | Wt 114.4 lb

## 2013-02-06 DIAGNOSIS — Z23 Encounter for immunization: Secondary | ICD-10-CM

## 2013-02-06 DIAGNOSIS — D649 Anemia, unspecified: Secondary | ICD-10-CM

## 2013-02-06 DIAGNOSIS — M545 Low back pain, unspecified: Secondary | ICD-10-CM

## 2013-02-06 DIAGNOSIS — Z09 Encounter for follow-up examination after completed treatment for conditions other than malignant neoplasm: Secondary | ICD-10-CM

## 2013-02-06 MED ORDER — FERROUS SULFATE 325 (65 FE) MG PO TABS: 325.0000 mg | ORAL_TABLET | Freq: Three times a day (TID) | ORAL | Status: DC

## 2013-02-06 NOTE — Progress Notes (Signed)
MRN: [id number] Name: Pamela Wilkinson  Sex: female Age: 38 y.o. DOB: [birth year]  Allergies: Review of patient's allergies indicates no known allergies.  Chief Complaint  Patient presents with  . Follow-up    HPI: Patient is 38 y.o. female who comes today for followup, she reported to have some lower back last week and as per patient it was radiating down to the leg, denies any more pain currently denies any numbness tingling or incontinence, previous blood work reviewed she has borderline anemia, has not been taking iron supplements.  History reviewed. No pertinent past medical history.  History reviewed. No pertinent past surgical history.    Medication List       This list is accurate as of: 02/06/13  2:34 PM.  Always use your most recent med list.               acetaminophen 325 MG tablet  Commonly known as:  TYLENOL  Take 650 mg by mouth every 6 (six) hours as needed. For pain     ferrous sulfate 325 (65 FE) MG tablet  Commonly known as:  FERROUSUL  Take 1 tablet (325 mg total) by mouth 3 (three) times daily with meals.     ibuprofen 800 MG tablet  Commonly known as:  ADVIL,MOTRIN  Take 1 tablet (800 mg total) by mouth 3 (three) times daily.        Meds ordered this encounter  Medications  . ferrous sulfate (FERROUSUL) 325 (65 FE) MG tablet    Sig: Take 1 tablet (325 mg total) by mouth 3 (three) times daily with meals.    Dispense:  90 tablet    Refill:  3     There is no immunization history on file for this patient.  Family History  Problem Relation Age of Onset  . Hypertension Mother   . Heart disease Maternal Aunt     History  Substance Use Topics  . Smoking status: Never Smoker   . Smokeless tobacco: Not on file  . Alcohol Use: Yes     Comment: social    Review of Systems  As noted in HPI  Filed Vitals:   02/06/13 1411  BP: 111/69  Pulse: 75  Temp: 98.2 F (36.8 C)  Resp: 16    Physical Exam  Physical Exam   Constitutional: No distress.  Eyes: EOM are normal. Pupils are equal, round, and reactive to light.  Cardiovascular: Normal rate and regular rhythm.   Pulmonary/Chest: Breath sounds normal. No respiratory distress. She has no wheezes. She has no rales.  Musculoskeletal:  No spinal or paraspinal tenderness, SLR test negative   Neurological: She has normal reflexes.    CBC    Component Value Date/Time   WBC 5.5 11/06/2012 1506   WBC 9.6 10/02/2012 1347   RBC 4.02 11/06/2012 1506   RBC 3.72* 10/02/2012 1347   HGB 11.6* 11/06/2012 1506   HGB 10.7* 10/02/2012 1347   HCT 34.6* 11/06/2012 1506   HCT 34.6* 10/02/2012 1347   PLT 252 11/06/2012 1506   MCV 86.1 11/06/2012 1506   MCV 93.0 10/02/2012 1347   LYMPHSABS 1.8 11/06/2012 1506   MONOABS 0.4 11/06/2012 1506   EOSABS 0.1 11/06/2012 1506   BASOSABS 0.0 11/06/2012 1506    CMP     Component Value Date/Time   NA 136 11/06/2012 1506   K 3.8 11/06/2012 1506   CL 105 11/06/2012 1506   CO2 24 11/06/2012 1506   GLUCOSE 83 11/06/2012 1506  BUN 11 11/06/2012 1506   CREATININE 0.62 11/06/2012 1506   CREATININE 0.74 11/20/2011 2343   CALCIUM 9.2 11/06/2012 1506   PROT 7.5 11/06/2012 1506   ALBUMIN 4.8 11/06/2012 1506   AST 12 11/06/2012 1506   ALT <8 11/06/2012 1506   ALKPHOS 45 11/06/2012 1506   BILITOT 0.4 11/06/2012 1506   GFRNONAA >90 11/20/2011 2343   GFRAA >90 11/20/2011 2343    No results found for this basename: chol,  tri,  ldl    No components found with this basename: hga1c    Lab Results  Component Value Date/Time   AST 12 11/06/2012  3:06 PM    Assessment and Plan  Follow up  Anemia - Plan: ferrous sulfate (FERROUSUL) 325 (65 FE) MG tablet Will repeat CBC on the following visit.  Needs flu shot Flu shot given today  Lower back pain  Resolved, advised patient to apply heating pad if needed  Return in about 4 months (around 06/06/2013), or if symptoms worsen or fail to improve.  Doris CheadleADVANI, Hosey Burmester, MD

## 2013-02-06 NOTE — Progress Notes (Signed)
Follow up Patient last seen to establish care and was treated for UTI

## 2015-10-28 ENCOUNTER — Encounter: Payer: Self-pay | Admitting: Family Medicine

## 2015-10-28 ENCOUNTER — Ambulatory Visit (INDEPENDENT_AMBULATORY_CARE_PROVIDER_SITE_OTHER): Payer: Self-pay | Admitting: Family Medicine

## 2015-10-28 VITALS — BP 129/71 | HR 66 | Temp 98.2°F | Resp 18 | Ht 62.0 in | Wt 118.0 lb

## 2015-10-28 DIAGNOSIS — Z0000 Encounter for general adult medical examination without abnormal findings: Secondary | ICD-10-CM

## 2015-10-28 DIAGNOSIS — M545 Low back pain, unspecified: Secondary | ICD-10-CM

## 2015-10-28 LAB — LIPID PANEL
Cholesterol: 219 mg/dL — ABNORMAL HIGH (ref 125–200)
HDL: 67 mg/dL (ref >=46)
LDL Cholesterol: 128 mg/dL (ref <130)
TRIGLYCERIDES: 122 mg/dL (ref <150)
Total CHOL/HDL Ratio: 3.3 Ratio (ref <=5.0)
VLDL: 24 mg/dL (ref <30)

## 2015-10-28 LAB — COMPLETE METABOLIC PANEL WITH GFR
ALT: 8 U/L (ref 6–29)
AST: 14 U/L (ref 10–30)
Albumin: 4.3 g/dL (ref 3.6–5.1)
Alkaline Phosphatase: 53 U/L (ref 33–115)
BUN: 8 mg/dL (ref 7–25)
CHLORIDE: 104 mmol/L (ref 98–110)
CO2: 26 mmol/L (ref 20–31)
Calcium: 9.3 mg/dL (ref 8.6–10.2)
Creat: 0.75 mg/dL (ref 0.50–1.10)
GFR, Est Non African American: >89 mL/min (ref >=60)
GLUCOSE: 86 mg/dL (ref 65–99)
Potassium: 4.5 mmol/L (ref 3.5–5.3)
Sodium: 138 mmol/L (ref 135–146)
TOTAL PROTEIN: 7.3 g/dL (ref 6.1–8.1)
Total Bilirubin: 0.4 mg/dL (ref 0.2–1.2)

## 2015-10-28 LAB — CBC WITH DIFFERENTIAL/PLATELET
BASOS PCT: 1 %
Basophils Absolute: 47 cells/uL (ref 0–200)
Eosinophils Absolute: 94 cells/uL (ref 15–500)
Eosinophils Relative: 2 %
HEMATOCRIT: 38.1 % (ref 35.0–45.0)
Hemoglobin: 12.4 g/dL (ref 11.7–15.5)
Lymphocytes Relative: 33 %
Lymphs Abs: 1551 cells/uL (ref 850–3900)
MCH: 28.8 pg (ref 27.0–33.0)
MCHC: 32.5 g/dL (ref 32.0–36.0)
MCV: 88.4 fL (ref 80.0–100.0)
MONO ABS: 423 {cells}/uL (ref 200–950)
MONOS PCT: 9 %
MPV: 8.6 fL (ref 7.5–12.5)
Neutro Abs: 2585 cells/uL (ref 1500–7800)
Neutrophils Relative %: 55 %
PLATELETS: 279 10*3/uL (ref 140–400)
RBC: 4.31 MIL/uL (ref 3.80–5.10)
RDW: 15.5 % — AB (ref 11.0–15.0)
WBC: 4.7 10*3/uL (ref 3.8–10.8)

## 2015-10-28 LAB — POCT URINALYSIS DIPSTICK
BILIRUBIN UA: NEGATIVE
GLUCOSE UA: NEGATIVE
Ketones, UA: NEGATIVE
LEUKOCYTES UA: NEGATIVE
NITRITE UA: NEGATIVE
Spec Grav, UA: 1.015
Urobilinogen, UA: 0.2
pH, UA: 8.5

## 2015-10-28 LAB — TSH: TSH: 1.37 mIU/L

## 2015-10-28 LAB — HEMOGLOBIN A1C
HEMOGLOBIN A1C: 4.8 % (ref <5.7)
MEAN PLASMA GLUCOSE: 91 mg/dL

## 2015-10-28 NOTE — Progress Notes (Signed)
Patient is here to establish care  Patient has not taken medication today. Patient has eaten today.  Patient would like to schedule a pap smear appointment.  Patient complains of lower back pain being present since yesterday. Pain is scaled currently at an 8 and described as aching.  Patient complains of abdominal pain being present pre and post her menstrual cycle (Patients recent cycle was last week). Patient states pain is scaled currently at a 5.

## 2015-10-28 NOTE — Patient Instructions (Signed)
Make an appointment to come back in 1 to 2 weeks for PAP smear.

## 2015-10-29 LAB — POCT URINALYSIS DIP (DEVICE)
Bilirubin Urine: NEGATIVE
Glucose, UA: NEGATIVE mg/dL
Ketones, ur: NEGATIVE mg/dL
Leukocytes, UA: NEGATIVE
NITRITE: NEGATIVE
PROTEIN: NEGATIVE mg/dL
Specific Gravity, Urine: 1.015 (ref 1.005–1.030)
Urobilinogen, UA: 0.2 mg/dL (ref 0.0–1.0)
pH: 8.5 — ABNORMAL HIGH (ref 5.0–8.0)

## 2015-10-29 NOTE — Progress Notes (Signed)
Pamela Wilkinson, is a 40 y.o. female  [id number]  [id number]  DOB - 1975/02/07  CC:  Chief Complaint  Patient presents with  . Establish Care       HPI: Pamela Wilkinson is a 40 y.o. female here to establish care. She reports, with the help of her husband, being generally healthy. She has not had routine health care in a number of years. She reports having a history of anemia and being on iron supplement.She reports occ sharp chest pain, occ abd pain, headaches 3-4 times a month. She reports low back and knee pain.  She is particularly interested in a well woman exam. She reports some bleeding with sex.   Health Maintenance: Declines flu shot today. Will order a mammogram and have her return in a week or two for a PAP. She reports a rash on her lower back related to her periods. (not present now).  No Known Allergies No past medical history on file. Current Outpatient Prescriptions on File Prior to Visit  Medication Sig Dispense Refill  . acetaminophen (TYLENOL) 325 MG tablet Take 650 mg by mouth every 6 (six) hours as needed. For pain    . ferrous sulfate (FERROUSUL) 325 (65 FE) MG tablet Take 1 tablet (325 mg total) by mouth 3 (three) times daily with meals. 90 tablet 3  . ibuprofen (ADVIL,MOTRIN) 800 MG tablet Take 1 tablet (800 mg total) by mouth 3 (three) times daily. 21 tablet 0   No current facility-administered medications on file prior to visit.    Family History  Problem Relation Age of Onset  . Hypertension Mother   . Heart disease Maternal Aunt    Social History   Social History  . Marital status: Married    Spouse name: N/A  . Number of children: N/A  . Years of education: N/A   Occupational History  . Not on file.   Social History Main Topics  . Smoking status: Never Smoker  . Smokeless tobacco: Never Used  . Alcohol use Yes     Comment: social  . Drug use: No  . Sexual activity: Yes    Birth control/ protection: None    Other Topics Concern  . Not on file   Social History Narrative  . No narrative on file    Review of Systems: Negative except as in HPI  Objective:   Vitals:   10/28/15 1442  BP: 129/71  Pulse: 66  Resp: 18  Temp: 98.2 F (36.8 C)    Physical Exam: Constitutional: Patient appears well-developed and well-nourished. No distress. HENT: Normocephalic, atraumatic, External right and left ear normal. Oropharynx is clear and moist.  Eyes: Conjunctivae and EOM are normal. PERRLA, no scleral icterus. Neck: Normal ROM. Neck supple. No lymphadenopathy, No thyromegaly. CVS: RRR, S1/S2 +, no murmurs, no gallops, no rubs Pulmonary: Effort and breath sounds normal, no stridor, rhonchi, wheezes, rales.  Abdominal: Soft. Normoactive BS,, no distension, tenderness, rebound or guarding.  Musculoskeletal: Normal range of motion. No edema and no tenderness.  Neuro: Alert.Normal muscle tone coordination. Non-focal Skin: Skin is warm and dry. No rash noted. Not diaphoretic. No erythema. No pallor. Psychiatric: Normal mood and affect. Behavior, judgment, thought content normal.  Lab Results  Component Value Date   WBC 4.7 10/28/2015   HGB 12.4 10/28/2015   HCT 38.1 10/28/2015   MCV 88.4 10/28/2015   PLT 279 10/28/2015   Lab Results  Component Value Date   CREATININE 0.75 10/28/2015   BUN 8 10/28/2015  NA 138 10/28/2015   K 4.5 10/28/2015   CL 104 10/28/2015   CO2 26 10/28/2015    Lab Results  Component Value Date   HGBA1C 4.8 10/28/2015   Lipid Panel     Component Value Date/Time   CHOL 219 (H) 10/28/2015 1536   TRIG 122 10/28/2015 1536   HDL 67 10/28/2015 1536   CHOLHDL 3.3 10/28/2015 1536   VLDL 24 10/28/2015 1536   LDLCALC 128 10/28/2015 1536       Assessment and plan:   1. Acute bilateral low back pain without sciatica  - Urinalysis Dipstick  2. Healthcare maintenance  - CBC with Differential - COMPLETE METABOLIC PANEL WITH GFR - Lipid panel - Hemoglobin  A1c - TSH - MM DIGITAL SCREENING BILATERAL; Future   Return in about two weeks for PAP  The patient was given clear instructions to go to ER or return to medical center if symptoms don't improve, worsen or new problems develop. The patient verbalized understanding.    Henrietta HooverLinda C Bernhardt FNP  10/29/2015, 2:43 PM

## 2015-12-11 ENCOUNTER — Ambulatory Visit: Payer: Self-pay | Admitting: Family Medicine

## 2015-12-18 ENCOUNTER — Ambulatory Visit
Admission: RE | Admit: 2015-12-18 | Discharge: 2015-12-18 | Disposition: A | Discharge status: Home / Self Care | Payer: No Typology Code available for payment source | Source: Ambulatory Visit | Attending: Family Medicine | Admitting: Family Medicine

## 2015-12-18 DIAGNOSIS — Z0000 Encounter for general adult medical examination without abnormal findings: Secondary | ICD-10-CM

## 2015-12-22 ENCOUNTER — Encounter: Payer: Self-pay | Admitting: Family Medicine

## 2015-12-22 ENCOUNTER — Other Ambulatory Visit (HOSPITAL_COMMUNITY)
Admission: RE | Admit: 2015-12-22 | Discharge: 2015-12-22 | Disposition: A | Discharge status: Home / Self Care | Payer: No Typology Code available for payment source | Source: Ambulatory Visit | Attending: Internal Medicine | Admitting: Internal Medicine

## 2015-12-22 ENCOUNTER — Ambulatory Visit (INDEPENDENT_AMBULATORY_CARE_PROVIDER_SITE_OTHER): Payer: Self-pay | Admitting: Family Medicine

## 2015-12-22 VITALS — BP 118/70 | HR 70 | Temp 98.5°F | Resp 12 | Ht 62.0 in | Wt 115.0 lb

## 2015-12-22 DIAGNOSIS — Z23 Encounter for immunization: Secondary | ICD-10-CM

## 2015-12-22 DIAGNOSIS — Z124 Encounter for screening for malignant neoplasm of cervix: Secondary | ICD-10-CM

## 2015-12-22 DIAGNOSIS — Z01419 Encounter for gynecological examination (general) (routine) without abnormal findings: Secondary | ICD-10-CM | POA: Insufficient documentation

## 2015-12-22 NOTE — Progress Notes (Signed)
Pamela Wilkinson, is a 40 y.o. female  [id number]  [id number]  DOB - 05/02/1975  CC:  Chief Complaint  Patient presents with  . pap smear  . pain in finger joint    comes and goes for 2-3 months       HPI: Pamela Wilkinson is a 40 y.o. female here for collection of pap smear. She was seen here recently and other health maintenance issues addressed. She has had her mammogram. She reports having regular periods, no between period bleeding, no unusual discharge. Some discomfort perimenstural.  We will give her a flu shot today.   No Known Allergies History reviewed. No pertinent past medical history. Current Outpatient Prescriptions on File Prior to Visit  Medication Sig Dispense Refill  . ferrous sulfate (FERROUSUL) 325 (65 FE) MG tablet Take 1 tablet (325 mg total) by mouth 3 (three) times daily with meals. 90 tablet 3  . ibuprofen (ADVIL,MOTRIN) 800 MG tablet Take 1 tablet (800 mg total) by mouth 3 (three) times daily. 21 tablet 0  . acetaminophen (TYLENOL) 325 MG tablet Take 650 mg by mouth every 6 (six) hours as needed. For pain     No current facility-administered medications on file prior to visit.    Family History  Problem Relation Age of Onset  . Hypertension Mother   . Heart disease Maternal Aunt    Social History   Social History  . Marital status: Married    Spouse name: N/A  . Number of children: N/A  . Years of education: N/A   Occupational History  . Not on file.   Social History Main Topics  . Smoking status: Never Smoker  . Smokeless tobacco: Never Used  . Alcohol use Yes     Comment: social  . Drug use: No  . Sexual activity: Yes    Birth control/ protection: None   Other Topics Concern  . Not on file   Social History Narrative  . No narrative on file    Review of Systems: See HPI  Objective:   Vitals:   12/22/15 1436  BP: 118/70  Pulse: 70  Resp: 12  Temp: 98.5 F (36.9 C)    Physical  Exam: Constitutional: Patient appears well-developed and well-nourished. No distress. Musculoskeletal: Normal range of motion. No edema and no tenderness.  Neuro: Alert.Normal muscle tone coordination. Non-focal Skin: Skin is warm and dry. No rash noted. Not diaphoretic. No erythema. No pallor. Psychiatric: Normal mood and affect. Behavior, judgment, thought content normal. GYN: midline, mildly friable, no unusual discharge. No CMT, no adnexal tenderness.  Lab Results  Component Value Date   WBC 4.7 10/28/2015   HGB 12.4 10/28/2015   HCT 38.1 10/28/2015   MCV 88.4 10/28/2015   PLT 279 10/28/2015   Lab Results  Component Value Date   CREATININE 0.75 10/28/2015   BUN 8 10/28/2015   NA 138 10/28/2015   K 4.5 10/28/2015   CL 104 10/28/2015   CO2 26 10/28/2015    Lab Results  Component Value Date   HGBA1C 4.8 10/28/2015   Lipid Panel     Component Value Date/Time   CHOL 219 (H) 10/28/2015 1536   TRIG 122 10/28/2015 1536   HDL 67 10/28/2015 1536   CHOLHDL 3.3 10/28/2015 1536   VLDL 24 10/28/2015 1536   LDLCALC 128 10/28/2015 1536        Assessment and plan:   1. Need for vaccination for H flu type B  - Flu Vaccine QUAD 36+  mos PF IM (Fluarix & Fluzone Quad PF)  2. Cervical cancer screening  - Cytology - PAP [place]   No Follow-up on file.  The patient was given clear instructions to go to ER or return to medical center if symptoms don't improve, worsen or new problems develop. The patient verbalized understanding.    Henrietta HooverLinda C Bernhardt FNP  12/22/2015, 3:15 PM

## 2015-12-24 LAB — CYTOLOGY - PAP: Diagnosis: NEGATIVE

## 2016-01-19 ENCOUNTER — Encounter (HOSPITAL_COMMUNITY): Payer: Self-pay

## 2016-01-19 ENCOUNTER — Emergency Department (HOSPITAL_COMMUNITY)
Admission: EM | Admit: 2016-01-19 | Discharge: 2016-01-20 | Disposition: A | Discharge status: Home / Self Care | Payer: No Typology Code available for payment source | Attending: Emergency Medicine | Admitting: Emergency Medicine

## 2016-01-19 DIAGNOSIS — N12 Tubulo-interstitial nephritis, not specified as acute or chronic: Secondary | ICD-10-CM

## 2016-01-19 LAB — COMPREHENSIVE METABOLIC PANEL
ALT: 13 U/L — ABNORMAL LOW (ref 14–54)
AST: 21 U/L (ref 15–41)
Albumin: 4.1 g/dL (ref 3.5–5.0)
Alkaline Phosphatase: 60 U/L (ref 38–126)
Anion gap: 9 (ref 5–15)
BILIRUBIN TOTAL: 1 mg/dL (ref 0.3–1.2)
BUN: 13 mg/dL (ref 6–20)
CALCIUM: 9.8 mg/dL (ref 8.9–10.3)
CO2: 21 mmol/L — ABNORMAL LOW (ref 22–32)
CREATININE: 0.57 mg/dL (ref 0.44–1.00)
Chloride: 105 mmol/L (ref 101–111)
GFR calc Af Amer: >60 mL/min (ref >60)
GFR calc non Af Amer: >60 mL/min (ref >60)
Glucose, Bld: 124 mg/dL — ABNORMAL HIGH (ref 65–99)
POTASSIUM: 3.3 mmol/L — AB (ref 3.5–5.1)
Sodium: 135 mmol/L (ref 135–145)
Total Protein: 7.5 g/dL (ref 6.5–8.1)

## 2016-01-19 LAB — URINALYSIS, ROUTINE W REFLEX MICROSCOPIC
Bilirubin Urine: NEGATIVE
GLUCOSE, UA: NEGATIVE mg/dL
Ketones, ur: NEGATIVE mg/dL
Nitrite: POSITIVE — AB
Protein, ur: NEGATIVE mg/dL
SPECIFIC GRAVITY, URINE: 1.01 (ref 1.005–1.030)
pH: 5 (ref 5.0–8.0)

## 2016-01-19 LAB — CBC WITH DIFFERENTIAL/PLATELET
BASOS ABS: 0 10*3/uL (ref 0.0–0.1)
BASOS PCT: 0 %
EOS ABS: 0 10*3/uL (ref 0.0–0.7)
EOS PCT: 0 %
HCT: 34.5 % — ABNORMAL LOW (ref 36.0–46.0)
Hemoglobin: 12 g/dL (ref 12.0–15.0)
LYMPHS PCT: 3 %
Lymphs Abs: 0.4 10*3/uL — ABNORMAL LOW (ref 0.7–4.0)
MCH: 30.7 pg (ref 26.0–34.0)
MCHC: 34.8 g/dL (ref 30.0–36.0)
MCV: 88.2 fL (ref 78.0–100.0)
Monocytes Absolute: 0.5 10*3/uL (ref 0.1–1.0)
Monocytes Relative: 4 %
Neutro Abs: 10.2 10*3/uL — ABNORMAL HIGH (ref 1.7–7.7)
Neutrophils Relative %: 93 %
PLATELETS: 191 10*3/uL (ref 150–400)
RBC: 3.91 MIL/uL (ref 3.87–5.11)
RDW: 13.5 % (ref 11.5–15.5)
WBC: 11 10*3/uL — AB (ref 4.0–10.5)

## 2016-01-19 LAB — LIPASE, BLOOD: LIPASE: 19 U/L (ref 11–51)

## 2016-01-19 LAB — I-STAT BETA HCG BLOOD, ED (MC, WL, AP ONLY)

## 2016-01-19 LAB — I-STAT CG4 LACTIC ACID, ED: Lactic Acid, Venous: 1.03 mmol/L (ref 0.5–1.9)

## 2016-01-19 MED ORDER — ONDANSETRON HCL 4 MG/2ML IJ SOLN
4.0000 mg | Freq: Once | INTRAMUSCULAR | Status: AC
  Administered 2016-01-19: 4 mg via INTRAVENOUS
  Filled 2016-01-19: qty 2

## 2016-01-19 MED ORDER — SODIUM CHLORIDE 0.9 % IV BOLUS (SEPSIS)
1000.0000 mL | Freq: Once | INTRAVENOUS | Status: AC
  Administered 2016-01-19: 1000 mL via INTRAVENOUS

## 2016-01-19 MED ORDER — CEFTRIAXONE SODIUM 1 G IJ SOLR
1.0000 g | Freq: Once | INTRAMUSCULAR | Status: AC
  Administered 2016-01-19: 1 g via INTRAVENOUS
  Filled 2016-01-19: qty 10

## 2016-01-19 MED ORDER — ACETAMINOPHEN 325 MG PO TABS
650.0000 mg | ORAL_TABLET | Freq: Once | ORAL | Status: AC | PRN
  Administered 2016-01-19: 650 mg via ORAL
  Filled 2016-01-19: qty 2

## 2016-01-19 MED ORDER — KETOROLAC TROMETHAMINE 15 MG/ML IJ SOLN
15.0000 mg | Freq: Once | INTRAMUSCULAR | Status: AC
  Administered 2016-01-19: 15 mg via INTRAVENOUS
  Filled 2016-01-19: qty 1

## 2016-01-19 MED ORDER — ONDANSETRON 4 MG PO TBDP
4.0000 mg | ORAL_TABLET | Freq: Once | ORAL | Status: AC | PRN
  Administered 2016-01-19: 4 mg via ORAL
  Filled 2016-01-19: qty 1

## 2016-01-19 MED ORDER — MORPHINE SULFATE (PF) 4 MG/ML IV SOLN
4.0000 mg | Freq: Once | INTRAVENOUS | Status: AC
  Administered 2016-01-19: 4 mg via INTRAVENOUS
  Filled 2016-01-19: qty 1

## 2016-01-19 NOTE — ED Provider Notes (Signed)
[facility]-EMERGENCY DEPT Provider Note   CSN: [id number] Arrival date & time: 01/19/16  1904     History   Chief Complaint Chief Complaint  Patient presents with  . Flank Pain  . Fever    HPI Pamela Wilkinson is a 41 y.o. female.  HPI   On Saturday began to have diarrhea x1 episode, then chills, 1-2 AM had body shaking, pain and fever medicine makes it better for a little bit  Low back pain Saturday night radiating to the front, chills, fever, on right side When go to bathroom feels like can't urinate 10/10 pain, comes and goes   History reviewed. No pertinent past medical history.  Patient Active Problem List   Diagnosis Date Noted  . Anemia 02/06/2013  . Pap smear for cervical cancer screening 11/06/2012  . Nipple pain 11/06/2012    History reviewed. No pertinent surgical history.  OB History    No data available       Home Medications    Prior to Admission medications   Medication Sig Start Date End Date Taking? Authorizing Provider  acetaminophen (TYLENOL) 325 MG tablet Take 650 mg by mouth every 6 (six) hours as needed. For pain   Yes Historical Provider, MD  cephALEXin (KEFLEX) 500 MG capsule Take 1 capsule (500 mg total) by mouth 3 (three) times daily. 01/20/16 02/03/16  Alvira Monday, MD  ferrous sulfate (FERROUSUL) 325 (65 FE) MG tablet Take 1 tablet (325 mg total) by mouth 3 (three) times daily with meals. Patient not taking: Reported on 01/19/2016 02/06/13   Doris Cheadle, MD  ibuprofen (ADVIL,MOTRIN) 800 MG tablet Take 1 tablet (800 mg total) by mouth 3 (three) times daily. Patient not taking: Reported on 01/19/2016 06/27/12   Gilda Crease, MD  ondansetron (ZOFRAN ODT) 4 MG disintegrating tablet Take 1 tablet (4 mg total) by mouth every 8 (eight) hours as needed for nausea or vomiting. 01/20/16   Alvira Monday, MD  traMADol (ULTRAM) 50 MG tablet Take 1 tablet (50 mg total) by mouth every 6 (six) hours as needed. 01/20/16   Alvira Monday, MD     Family History Family History  Problem Relation Age of Onset  . Hypertension Mother   . Heart disease Maternal Aunt     Social History Social History  Substance Use Topics  . Smoking status: Never Smoker  . Smokeless tobacco: Never Used  . Alcohol use Yes     Comment: social     Allergies   Patient has no known allergies.   Review of Systems Review of Systems  Constitutional: Positive for appetite change, chills and fever.  HENT: Negative for sore throat.   Eyes: Negative for visual disturbance.  Respiratory: Positive for cough (a tiny bit). Negative for shortness of breath.   Cardiovascular: Negative for chest pain.  Gastrointestinal: Positive for abdominal pain, diarrhea (1 time) and nausea. Negative for vomiting.  Genitourinary: Positive for difficulty urinating and flank pain.  Musculoskeletal: Positive for back pain. Negative for neck pain.  Skin: Negative for rash.  Neurological: Negative for syncope and headaches.     Physical Exam Updated Vital Signs BP 109/65   Pulse 85   Temp 102.2 F (39 C) (Oral)   Resp 17   Ht 5' (1.524 m)   Wt 135 lb (61.2 kg)   LMP 01/04/2016   SpO2 100%   BMI 26.37 kg/m   Physical Exam  Constitutional: She is oriented to person, place, and time. She appears well-developed and well-nourished. She  appears ill. No distress.  HENT:  Head: Normocephalic and atraumatic.  Eyes: Conjunctivae and EOM are normal.  Neck: Normal range of motion.  Cardiovascular: Regular rhythm, normal heart sounds and intact distal pulses.  Tachycardia present.  Exam reveals no gallop and no friction rub.   No murmur heard. Pulmonary/Chest: Effort normal and breath sounds normal. No respiratory distress. She has no wheezes. She has no rales.  Abdominal: Soft. She exhibits no distension. There is tenderness (mild LLQ). There is no guarding, no CVA tenderness, no tenderness at McBurney's point and negative Murphy's sign.  Musculoskeletal: She  exhibits no edema or tenderness.  Neurological: She is alert and oriented to person, place, and time.  Skin: Skin is warm and dry. No rash noted. She is not diaphoretic. No erythema.  Nursing note and vitals reviewed.    ED Treatments / Results  Labs (all labs ordered are listed, but only abnormal results are displayed) Labs Reviewed  COMPREHENSIVE METABOLIC PANEL - Abnormal; Notable for the following:       Result Value   Potassium 3.3 (*)    CO2 21 (*)    Glucose, Bld 124 (*)    ALT 13 (*)    All other components within normal limits  CBC WITH DIFFERENTIAL/PLATELET - Abnormal; Notable for the following:    WBC 11.0 (*)    HCT 34.5 (*)    Neutro Abs 10.2 (*)    Lymphs Abs 0.4 (*)    All other components within normal limits  URINALYSIS, ROUTINE W REFLEX MICROSCOPIC - Abnormal; Notable for the following:    APPearance HAZY (*)    Hgb urine dipstick MODERATE (*)    Nitrite POSITIVE (*)    Leukocytes, UA LARGE (*)    Bacteria, UA MANY (*)    Squamous Epithelial / LPF 0-5 (*)    All other components within normal limits  URINE CULTURE  CULTURE, BLOOD (ROUTINE X 2)  CULTURE, BLOOD (ROUTINE X 2)  LIPASE, BLOOD  I-STAT CG4 LACTIC ACID, ED  I-STAT BETA HCG BLOOD, ED ([facility], [facility], [facility] ONLY)    EKG  EKG Interpretation  Date/Time:  Monday January 19 2016 19:56:51 EST Ventricular Rate:  118 PR Interval:    QRS Duration: 73 QT Interval:  293 QTC Calculation: 411 R Axis:   82 Text Interpretation:  Sinus tachycardia Baseline wander in lead(s) II III aVF No old tracing to compare Confirmed by BELFI  MD, MELANIE ([id number]) on 01/19/2016 11:59:33 PM Also confirmed by BELFI  MD, MELANIE ([id number]), editor [place]  CCT, BEVERLY ([id number])  on 01/20/2016 6:46:09 AM       Radiology No results found.  Procedures Procedures (including critical care time)  Medications Ordered in ED Medications  acetaminophen (TYLENOL) tablet 650 mg (650 mg Oral Given 01/19/16 1959)  ondansetron (ZOFRAN-ODT)  disintegrating tablet 4 mg (4 mg Oral Given 01/19/16 1959)  sodium chloride 0.9 % bolus 1,000 mL (0 mLs Intravenous Stopped 01/20/16 0017)  sodium chloride 0.9 % bolus 1,000 mL (0 mLs Intravenous Stopped 01/19/16 2248)  ondansetron (ZOFRAN) injection 4 mg (4 mg Intravenous Given 01/19/16 2147)  cefTRIAXone (ROCEPHIN) 1 g in dextrose 5 % 50 mL IVPB (0 g Intravenous Stopped 01/19/16 2247)  morphine 4 MG/ML injection 4 mg (4 mg Intravenous Given 01/19/16 2254)  ketorolac (TORADOL) 15 MG/ML injection 15 mg (15 mg Intravenous Given 01/19/16 2254)     Initial Impression / Assessment and Plan / ED Course  I have reviewed the triage vital signs and  the nursing notes.  Pertinent labs & imaging results that were available during my care of the patient were reviewed by me and considered in my medical decision making (see chart for details).  Clinical Course    41yo female who speaks Spanish and has no other medical history presents with concern for fever, right abdomen and flank pain.  While pain was located on right side, she has no right sided tenderness on exam and have low suspicion for appendicitis, cholecystitis.  Initially considered CT scan for diverticulitis/nephrolithiasis however on reexamination pain resolved and have low suspicion for these diagnoses.    History of urinary hesitancy, right flank pain and urinalysis results consistent with pyelonephritis. Low suspicion for perinephric abscess, however discussed that if symptoms worsen or do not improve she should return to the ED.  Heart rate improved with fluids. Had a few BP high 90s systolic while sleeping, normal MAPs, continued hemodynamic stability while awake.  Pt given initial dose of rocephin in ED. Able to tolerate po and pain resolved. Will give keflex x2weeks, zofran, gave small rx for tramadol. Patient discharged in stable condition with understanding of reasons to return.   Final Clinical Impressions(s) / ED Diagnoses   Final diagnoses:    Pyelonephritis    New Prescriptions Discharge Medication List as of 01/20/2016 12:36 AM    START taking these medications   Details  cephALEXin (KEFLEX) 500 MG capsule Take 1 capsule (500 mg total) by mouth 3 (three) times daily., Starting Tue 01/20/2016, Until Tue 02/03/2016, Print    ondansetron (ZOFRAN ODT) 4 MG disintegrating tablet Take 1 tablet (4 mg total) by mouth every 8 (eight) hours as needed for nausea or vomiting., Starting Tue 01/20/2016, Print    traMADol (ULTRAM) 50 MG tablet Take 1 tablet (50 mg total) by mouth every 6 (six) hours as needed., Starting Tue 01/20/2016, Print         Alvira MondayErin Valta Dillon, MD 01/20/16 1021

## 2016-01-19 NOTE — ED Triage Notes (Addendum)
PT /O RIGHT FLANK PAIN RADIATING TO THE RLQ WITH NAUSEA, FEVER, AND DIARRHEA SINCE Saturday. PT STS SHE HAS BEEN URINATING VERY LITTLE SINCE Saturday, AS WELL. PT HAS BEEN TAKING OTC PAIN RELIEVER W/O NO RELIEF.

## 2016-01-20 MED ORDER — ONDANSETRON 4 MG PO TBDP: 4.0000 mg | ORAL_TABLET | Freq: Three times a day (TID) | ORAL | 0 refills | Status: DC | PRN

## 2016-01-20 MED ORDER — TRAMADOL HCL 50 MG PO TABS: 50.0000 mg | ORAL_TABLET | Freq: Four times a day (QID) | ORAL | 0 refills | Status: DC | PRN

## 2016-01-20 MED ORDER — CEPHALEXIN 500 MG PO CAPS: 500.0000 mg | ORAL_CAPSULE | Freq: Three times a day (TID) | ORAL | 0 refills | Status: AC

## 2016-01-20 NOTE — Discharge Instructions (Signed)
You may take tylenol 1000mg  every 6 hours for pain and ibuprofen 400mg  every 4 hours. Take the tramadol for breakthrough pain.

## 2016-01-22 LAB — URINE CULTURE

## 2016-01-23 ENCOUNTER — Telehealth (HOSPITAL_BASED_OUTPATIENT_CLINIC_OR_DEPARTMENT_OTHER): Payer: Self-pay

## 2016-01-23 NOTE — Telephone Encounter (Signed)
Post ED Visit - Positive Culture Follow-up  Culture report reviewed by antimicrobial stewardship pharmacist:  []  Enzo BiNathan Batchelder, Pharm.D. []  Celedonio MiyamotoJeremy Frens, Pharm.D., BCPS []  Garvin FilaMike Maccia, Pharm.D. []  Georgina PillionElizabeth Martin, Pharm.D., BCPS []  Hat IslandMinh Pham, [address]Ph[address]D., BCPS, AAHIVP []  Estella HuskMichelle Turner, Pharm.D., BCPS, AAHIVP []  Tennis Mustassie Stewart, Pharm.D. []  Rob ClaringtonVincent, [address]Ph[address]Ophelia Shoulder. X  Margaret Shuda, Pharm.D.  Positive urine culture, >/= 100,000 colonies -> E Coli Treated with Cephalexin, organism sensitive to the same and no further patient follow-up is required at this time.  Pamela Wilkinson, Pamela Wilkinson 01/23/2016, 11:01 AM

## 2016-01-25 LAB — CULTURE, BLOOD (ROUTINE X 2)
CULTURE: NO GROWTH
Culture: NO GROWTH

## 2017-06-13 ENCOUNTER — Ambulatory Visit (INDEPENDENT_AMBULATORY_CARE_PROVIDER_SITE_OTHER): Payer: Self-pay | Admitting: Family Medicine

## 2017-06-13 ENCOUNTER — Encounter: Payer: Self-pay | Admitting: Family Medicine

## 2017-06-13 VITALS — BP 122/74 | HR 78 | Temp 97.8°F | Ht 60.0 in | Wt 121.0 lb

## 2017-06-13 DIAGNOSIS — J302 Other seasonal allergic rhinitis: Secondary | ICD-10-CM

## 2017-06-13 DIAGNOSIS — Z7689 Persons encountering health services in other specified circumstances: Secondary | ICD-10-CM

## 2017-06-13 DIAGNOSIS — R040 Epistaxis: Secondary | ICD-10-CM

## 2017-06-13 DIAGNOSIS — Z09 Encounter for follow-up examination after completed treatment for conditions other than malignant neoplasm: Secondary | ICD-10-CM

## 2017-06-13 DIAGNOSIS — Z0000 Encounter for general adult medical examination without abnormal findings: Secondary | ICD-10-CM

## 2017-06-13 DIAGNOSIS — Z1231 Encounter for screening mammogram for malignant neoplasm of breast: Secondary | ICD-10-CM

## 2017-06-13 LAB — POCT URINALYSIS DIP (MANUAL ENTRY)
Bilirubin, UA: NEGATIVE
Glucose, UA: NEGATIVE mg/dL
Ketones, POC UA: NEGATIVE mg/dL
Nitrite, UA: NEGATIVE
Protein Ur, POC: NEGATIVE mg/dL
Spec Grav, UA: 1.02 (ref 1.010–1.025)
Urobilinogen, UA: 0.2 E.U./dL
pH, UA: 5.5 (ref 5.0–8.0)

## 2017-06-13 NOTE — Progress Notes (Signed)
Subjective:    Patient ID: Pamela Wilkinson, female    DOB: [birth year], 42 y.o.   MRN: [id number]  PCP: Raliegh Ip, NP  Chief Complaint  Patient presents with  . Establish Care  . Nasal Congestion    noses bleeds    HPI  Ms. Pamela Wilkinson has a history of Pyelonephritis, Seasonal Allergies. She was last seen at our office in 2017. She is here today to re-establish care.   Current Status: Since her last office visit she has been doing well. She has began to experience seasonal allergies in the fall and spring. She has been having occasional nose bleeds. Last nose bleed was 2 weeks ago, which lasted for 7-10 minutes. 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  She is married and has 3 children. Her periods are normally 4-5 days. Lately, for the past 2 years,  her periods have been much heavier, she has notices clots. Her last period was 3 weeks ago.    She states that she has been having ongoing generalized abdominal pain continuously for the past 2 years. She has noticed pain in her left breast area.   She denies fevers, chills, recent infections, fatigue, weight loss, and night sweats. Denies visual changes, headaches, dizziness, and falls. Denies cough, chest pain, heart palpitations, and shortness of breath.   She has a good appetite. Denies abdominal pain, nausea, vomiting, diarrhea, and constipation. She denies blood in stools, dysuria, and hematuria.   She does not have anxiety.   She denies pain today.   History reviewed. No pertinent past medical history.  Family History  Problem Relation Age of Onset  . Hypertension Mother   . Heart disease Maternal Aunt     Social History   Socioeconomic History  . Marital status: Married    Spouse name: Not on file  . Number of children: Not on file  . Years of education: Not on file  . Highest education level: Not on file  Occupational History  . Not on file  Social Needs  . Financial  resource strain: Not on file  . Food insecurity:    Worry: Not on file    Inability: Not on file  . Transportation needs:    Medical: Not on file    Non-medical: Not on file  Tobacco Use  . Smoking status: Never Smoker  . Smokeless tobacco: Never Used  Substance and Sexual Activity  . Alcohol use: Yes    Comment: social  . Drug use: No  . Sexual activity: Yes    Birth control/protection: None  Lifestyle  . Physical activity:    Days per week: Not on file    Minutes per session: Not on file  . Stress: Not on file  Relationships  . Social connections:    Talks on phone: Not on file    Gets together: Not on file    Attends religious service: Not on file    Active member of club or organization: Not on file    Attends meetings of clubs or organizations: Not on file    Relationship status: Not on file  . Intimate partner violence:    Fear of current or ex partner: Not on file    Emotionally abused: Not on file    Physically abused: Not on file    Forced sexual activity: Not on file  Other Topics Concern  . Not on file  Social History Narrative  . Not on file  History reviewed. No pertinent surgical history.   Immunization History  Administered Date(s) Administered  . Influenza,inj,Quad PF,6+ Mos 02/06/2013, 12/22/2015    Current Outpatient Medications on File Prior to Visit  Medication Sig Dispense Refill  . ferrous sulfate (FERROUSUL) 325 (65 FE) MG tablet Take 1 tablet (325 mg total) by mouth 3 (three) times daily with meals. (Patient not taking: Reported on 01/19/2016) 90 tablet 3  . traMADol (ULTRAM) 50 MG tablet Take 1 tablet (50 mg total) by mouth every 6 (six) hours as needed. (Patient not taking: Reported on 06/13/2017) 6 tablet 0   No current facility-administered medications on file prior to visit.    No Known Allergies  BP 122/74 (BP Location: Left Arm, Patient Position: Sitting, Cuff Size: Small)   Pulse 78   Temp 97.8 F (36.6 C) (Oral)   Ht 5' (1.524  m)   Wt 121 lb (54.9 kg)   LMP 05/30/2017   SpO2 100%   BMI 23.63 kg/m     Review of Systems  Constitutional: Negative.   HENT: Negative.   Eyes: Negative.   Respiratory: Negative.   Cardiovascular: Negative.   Gastrointestinal: Negative.   Endocrine: Negative.   Genitourinary: Negative.   Musculoskeletal: Negative.   Allergic/Immunologic:       Seasonal allergies  Hematological: Negative.   Psychiatric/Behavioral: Negative.      Objective:   Physical Exam  Constitutional: She appears well-developed and well-nourished.  HENT:  Head: Normocephalic and atraumatic.  Eyes: Pupils are equal, round, and reactive to light. Conjunctivae and EOM are normal.  Neck: Normal range of motion. Neck supple.  Cardiovascular: Normal rate, regular rhythm, normal heart sounds and intact distal pulses.  Pulmonary/Chest: Effort normal and breath sounds normal.  Abdominal: Soft. Bowel sounds are normal.  Musculoskeletal: Normal range of motion.  Neurological: She is alert.  Skin: Skin is warm and dry. Capillary refill takes less than 2 seconds.  Psychiatric: She has a normal mood and affect. Her behavior is normal. Judgment and thought content normal.  Nursing note and vitals reviewed.   Assessment & Plan:   1. Encounter to establish care - POCT urinalysis dipstick  2. Healthcare maintenance - CBC with Differential - Comprehensive metabolic panel - Lipase - Lipid Panel - TSH - Vitamin D, 25-hydroxy - Vitamin B12 - Urinalysis Dipstick  3. Epistaxis Nosebleed self-care With the right self-care, most nosebleeds stop on their own. 1. Blow your nose. This might increase the bleeding for a moment, but that's OK. 2. Sit or stand while bending forward a little at the waist. DO NOT lie down or tilt your head back. 3. Pinch the soft area towards the bottom of your nose, below the bone (picture 1). Do NOT grip the bridge of your nose between your eyes. That will not work. DO NOT press on  just 1 side, even if the bleeding is only on 1 side. That will not work either. 4. Squeeze your nose shut for at least 15 minutes.  Do not release the pressure before the time is up to check if the bleeding has stopped. If you keep checking, you will ruin your chances of getting the bleeding to stop. If you follow these steps, and your nose keeps bleeding, repeat all the steps once more. Apply pressure for a total of at least 30 minutes  If you are still bleeding, go to the emergency room or an urgent care clinic.  - CBC with Differential  4. Screening mammogram, encounter for Left breast  pain.  - MM Digital Screening  5. Seasonal allergies Stable.  Monitor.   6. Follow up  Follow up in 2 weeks.  No orders of the defined types were placed in this encounter.  Raliegh IpNatalie Rahn Lacuesta,  MSN, FNP-BC [facility] [address]  [place], [place] [id number] [phone]

## 2017-06-14 LAB — CBC WITH DIFFERENTIAL/PLATELET
Basophils Absolute: 0 10*3/uL (ref 0.0–0.2)
Basos: 1 %
EOS (ABSOLUTE): 0.3 10*3/uL (ref 0.0–0.4)
Eos: 7 %
Hematocrit: 38.4 % (ref 34.0–46.6)
Hemoglobin: 12.6 g/dL (ref 11.1–15.9)
Immature Grans (Abs): 0 10*3/uL (ref 0.0–0.1)
Immature Granulocytes: 0 %
Lymphocytes Absolute: 1.2 10*3/uL (ref 0.7–3.1)
Lymphs: 29 %
MCH: 30.7 pg (ref 26.6–33.0)
MCHC: 32.8 g/dL (ref 31.5–35.7)
MCV: 93 fL (ref 79–97)
Monocytes Absolute: 0.2 10*3/uL (ref 0.1–0.9)
Monocytes: 5 %
Neutrophils Absolute: 2.5 10*3/uL (ref 1.4–7.0)
Neutrophils: 58 %
Platelets: 240 10*3/uL (ref 150–450)
RBC: 4.11 x10E6/uL (ref 3.77–5.28)
RDW: 15.1 % (ref 12.3–15.4)
WBC: 4.3 10*3/uL (ref 3.4–10.8)

## 2017-06-14 LAB — COMPREHENSIVE METABOLIC PANEL
ALT: 10 IU/L (ref 0–32)
AST: 12 IU/L (ref 0–40)
Albumin/Globulin Ratio: 1.8 (ref 1.2–2.2)
Albumin: 4.6 g/dL (ref 3.5–5.5)
Alkaline Phosphatase: 57 IU/L (ref 39–117)
BUN/Creatinine Ratio: 17 (ref 9–23)
BUN: 13 mg/dL (ref 6–24)
Bilirubin Total: 0.2 mg/dL (ref 0.0–1.2)
CO2: 19 mmol/L — ABNORMAL LOW (ref 20–29)
Calcium: 9.4 mg/dL (ref 8.7–10.2)
Chloride: 108 mmol/L — ABNORMAL HIGH (ref 96–106)
Creatinine, Ser: 0.77 mg/dL (ref 0.57–1.00)
GFR calc Af Amer: 110 mL/min/{1.73_m2} (ref >59)
GFR calc non Af Amer: 96 mL/min/{1.73_m2} (ref >59)
Globulin, Total: 2.5 g/dL (ref 1.5–4.5)
Glucose: 76 mg/dL (ref 65–99)
Potassium: 4.2 mmol/L (ref 3.5–5.2)
Sodium: 142 mmol/L (ref 134–144)
Total Protein: 7.1 g/dL (ref 6.0–8.5)

## 2017-06-14 LAB — LIPID PANEL
Chol/HDL Ratio: 2.9 ratio (ref 0.0–4.4)
Cholesterol, Total: 199 mg/dL (ref 100–199)
HDL: 69 mg/dL (ref >39)
LDL Calculated: 115 mg/dL — ABNORMAL HIGH (ref 0–99)
Triglycerides: 76 mg/dL (ref 0–149)
VLDL Cholesterol Cal: 15 mg/dL (ref 5–40)

## 2017-06-14 LAB — LIPASE: Lipase: 36 U/L (ref 14–72)

## 2017-06-14 LAB — VITAMIN B12: Vitamin B-12: 707 pg/mL (ref 232–1245)

## 2017-06-14 LAB — VITAMIN D 25 HYDROXY (VIT D DEFICIENCY, FRACTURES): Vit D, 25-Hydroxy: 21.7 ng/mL — ABNORMAL LOW (ref 30.0–100.0)

## 2017-06-14 LAB — TSH: TSH: 2.84 u[IU]/mL (ref 0.450–4.500)

## 2017-06-19 ENCOUNTER — Other Ambulatory Visit: Payer: Self-pay | Admitting: Family Medicine

## 2017-06-19 DIAGNOSIS — N644 Mastodynia: Secondary | ICD-10-CM

## 2017-06-20 ENCOUNTER — Telehealth: Payer: Self-pay

## 2017-06-20 NOTE — Telephone Encounter (Signed)
-----   Message from Kallie LocksNatalie M Stroud, FNP sent at 06/15/2017 10:23 PM EDT ----- Regarding: "Labs results" Lyla Sonarrie,   Please call patient in inform that we have referred her for Mammogram.  Dorene GrebeNatalie.

## 2017-06-20 NOTE — Telephone Encounter (Signed)
Tired to contact patient no answer and no vm 

## 2017-06-20 NOTE — Telephone Encounter (Signed)
Patient notified

## 2017-06-20 NOTE — Telephone Encounter (Signed)
-----   Message from Natalie M Stroud, FNP sent at 06/15/2017 10:23 PM EDT ----- Regarding: "Labs results" Carrie,   Please call patient in inform that we have referred her for Mammogram.  Natalie.  

## 2017-08-03 ENCOUNTER — Other Ambulatory Visit: Payer: Self-pay | Admitting: Obstetrics and Gynecology

## 2017-08-03 DIAGNOSIS — Z1231 Encounter for screening mammogram for malignant neoplasm of breast: Secondary | ICD-10-CM

## 2017-08-05 ENCOUNTER — Ambulatory Visit: Payer: Self-pay

## 2017-08-12 ENCOUNTER — Ambulatory Visit: Payer: Self-pay | Attending: Family Medicine

## 2017-09-29 ENCOUNTER — Ambulatory Visit (HOSPITAL_COMMUNITY)
Admission: RE | Admit: 2017-09-29 | Discharge: 2017-09-29 | Disposition: A | Discharge status: Home / Self Care | Payer: Self-pay | Source: Ambulatory Visit | Attending: Obstetrics and Gynecology | Admitting: Obstetrics and Gynecology

## 2017-09-29 ENCOUNTER — Encounter (HOSPITAL_COMMUNITY): Payer: Self-pay

## 2017-09-29 ENCOUNTER — Ambulatory Visit
Admission: RE | Admit: 2017-09-29 | Discharge: 2017-09-29 | Disposition: A | Discharge status: Home / Self Care | Payer: No Typology Code available for payment source | Source: Ambulatory Visit | Attending: Obstetrics and Gynecology | Admitting: Obstetrics and Gynecology

## 2017-09-29 ENCOUNTER — Other Ambulatory Visit (HOSPITAL_COMMUNITY): Payer: Self-pay | Admitting: *Deleted

## 2017-09-29 VITALS — BP 102/64 | Wt 111.4 lb

## 2017-09-29 DIAGNOSIS — Z1239 Encounter for other screening for malignant neoplasm of breast: Secondary | ICD-10-CM

## 2017-09-29 DIAGNOSIS — N644 Mastodynia: Secondary | ICD-10-CM

## 2017-09-29 DIAGNOSIS — Z1231 Encounter for screening mammogram for malignant neoplasm of breast: Secondary | ICD-10-CM

## 2017-09-29 NOTE — Progress Notes (Signed)
Complaints of left upper breast cramps that feels like needles x 2-3 months. Patient states it happens twice a month and last for about 2 minutes each time. Patient rates the pain at a 8 out of 10 when occurs. Patient stated she is not having any breast pain today.  Pap Smear: Pap smear not completed today. [facility] and normal. Per patient has no history of an abnormal Pap smear. Last Pap smear result is in Epic.  Physical exam: Breasts Breasts symmetrical. No skin abnormalities bilateral breasts. No nipple retraction bilateral breasts. No nipple discharge bilateral breasts. No lymphadenopathy. No lumps palpated bilateral breasts. No complaints of pain or tenderness on exam. [facility]. Appointment scheduled for Thursday, September 29, 2017 at 1540.        Pelvic/Bimanual No Pap smear completed today since last Pap smear was 12/22/2015. Pap smear not indicated per BCCCP guidelines.   Smoking History: Patient has never smoked.  Patient Navigation: Patient education provided. Access to services provided for patient through [facility]. Spanish interpreter provided.   Breast and Cervical Cancer Risk Assessment: Patient has a family history of a maternal aunt having breast cancer. Patient has no known genetic mutations or history of radiation treatment to the chest before age 89. Patient has no history of cervical dysplasia, immunocompromised, or DES exposure in-utero.  Risk Assessment    Risk Scores      09/29/2017   Last edited by: Lynnell DikeHolland, Sabrina H, LPN   5-year risk: 0.4 %   Lifetime risk: 5.7 %         Used Spanish interpreter Natale LayErika McReynolds from [facility].

## 2017-09-29 NOTE — Patient Instructions (Signed)
Explained breast self awareness with Purvis SheffieldGloria Anel Galmiche Hernandez. Patient did not need a Pap smear today due to last Pap smear was 12/22/2015. Let her know BCCCP will cover Pap smears every 3 years unless has a history of abnormal Pap smears. [facility]. Appointment scheduled for Thursday, September 29, 2017 at 1540. Let patient know the Breast Center will follow up with her within the next couple weeks with results of mammogram by letter or phone. Purvis SheffieldGloria Anel Galmiche Hernandez verbalized understanding.  Nataya Bastedo, Kathaleen Maserhristine Poll, RN 3:38 PM

## 2017-09-30 ENCOUNTER — Encounter (HOSPITAL_COMMUNITY): Payer: Self-pay | Admitting: *Deleted

## 2017-11-01 ENCOUNTER — Other Ambulatory Visit: Payer: Self-pay

## 2017-11-01 ENCOUNTER — Encounter: Payer: Self-pay | Admitting: Family Medicine

## 2017-11-01 ENCOUNTER — Ambulatory Visit (INDEPENDENT_AMBULATORY_CARE_PROVIDER_SITE_OTHER): Payer: Self-pay | Admitting: Family Medicine

## 2017-11-01 VITALS — BP 116/68 | HR 76 | Temp 98.0°F | Ht 60.0 in | Wt 113.0 lb

## 2017-11-01 DIAGNOSIS — R1084 Generalized abdominal pain: Secondary | ICD-10-CM

## 2017-11-01 DIAGNOSIS — N390 Urinary tract infection, site not specified: Secondary | ICD-10-CM

## 2017-11-01 DIAGNOSIS — Z09 Encounter for follow-up examination after completed treatment for conditions other than malignant neoplasm: Secondary | ICD-10-CM

## 2017-11-01 DIAGNOSIS — F418 Other specified anxiety disorders: Secondary | ICD-10-CM

## 2017-11-01 DIAGNOSIS — R4589 Other symptoms and signs involving emotional state: Secondary | ICD-10-CM

## 2017-11-01 DIAGNOSIS — R0789 Other chest pain: Secondary | ICD-10-CM

## 2017-11-01 DIAGNOSIS — R5383 Other fatigue: Secondary | ICD-10-CM

## 2017-11-01 DIAGNOSIS — Z23 Encounter for immunization: Secondary | ICD-10-CM

## 2017-11-01 DIAGNOSIS — Z131 Encounter for screening for diabetes mellitus: Secondary | ICD-10-CM

## 2017-11-01 DIAGNOSIS — R8290 Unspecified abnormal findings in urine: Secondary | ICD-10-CM

## 2017-11-01 DIAGNOSIS — Z9289 Personal history of other medical treatment: Secondary | ICD-10-CM

## 2017-11-01 LAB — POCT URINALYSIS DIP (MANUAL ENTRY)
Bilirubin, UA: NEGATIVE
Glucose, UA: NEGATIVE mg/dL
Ketones, POC UA: NEGATIVE mg/dL
Nitrite, UA: NEGATIVE
Protein Ur, POC: NEGATIVE mg/dL
Spec Grav, UA: 1.015 (ref 1.010–1.025)
Urobilinogen, UA: 0.2 E.U./dL
pH, UA: 7 (ref 5.0–8.0)

## 2017-11-01 LAB — POCT GLYCOSYLATED HEMOGLOBIN (HGB A1C): Hemoglobin A1C: 4.8 % (ref 4.0–5.6)

## 2017-11-01 MED ORDER — SULFAMETHOXAZOLE-TRIMETHOPRIM 800-160 MG PO TABS: 1.0000 | ORAL_TABLET | Freq: Two times a day (BID) | ORAL | 0 refills | Status: DC

## 2017-11-01 MED ORDER — BUSPIRONE HCL 5 MG PO TABS: 5.0000 mg | ORAL_TABLET | Freq: Two times a day (BID) | ORAL | 1 refills | Status: DC

## 2017-11-01 NOTE — Patient Instructions (Addendum)
Sulfamethoxazole; Trimethoprim, SMX-TMP oral suspension What is this medicine? SULFAMETHOXAZOLE; TRIMETHOPRIM or SMX-TMP (suhl fuh meth OK suh zohl; trye METH oh prim) is a combination of a sulfonamide antibiotic and a second antibiotic, trimethoprim. It is used to treat or prevent certain kinds of bacterial infections.It will not work for colds, flu, or other viral infections. This medicine may be used for other purposes; ask your health care provider or pharmacist if you have questions. COMMON BRAND NAME(S): Septra, Sulfatrim, Sulfatrim Pediatric, Sultrex Pediatric What should I tell my health care provider before I take this medicine? They need to know if you have any of these conditions: -anemia -asthma -being treated with anticonvulsants -if you frequently drink alcohol containing drinks -kidney disease -liver disease -low level of folic acid or glucose-6-phosphate dehydrogenase -poor nutrition or malabsorption -porphyria -severe allergies -thyroid disorder -an unusual or allergic reaction to sulfamethoxazole, trimethoprim, sulfa drugs, other medicines, foods, dyes, or preservatives -pregnant or trying to get pregnant -breast-feeding How should I use this medicine? Take this suspension by mouth. Follow the directions on the prescription label. Shake the bottle well before taking. Use a specially marked spoon or container to measure your medicine. Ask your pharmacist if you do not have one. Household spoons are not accurate. Take your doses at regular intervals. Do not take more medicine than directed. Talk to your pediatrician regarding the use of this medicine in children. Special care may be needed. While this drug may be prescribed for children as young as 2 months of age for selected conditions, precautions do apply. Overdosage: If you think you have taken too much of this medicine contact a poison control center or emergency room at once. NOTE: This medicine is only for you. Do not  share this medicine with others. What if I miss a dose? If you miss a dose, take it as soon as you can. If it is almost time for your next dose, take only that dose. Do not take double or extra doses. What may interact with this medicine? Do not take this medicine with any of the following medications -aminobenzoate potassium -dofetilide -metronidazole This medicine may also interact with the following medications -ACE inhibitors like benazepril, enalapril, lisinopril, and ramipril -birth control pills -cyclosporine -digoxin -diuretics -indomethacin -medicines for diabetes -methenamine -methotrexate -phenytoin -potassium supplements -pyrimethamine -sulfinpyrazone -tricyclic antidepressants -warfarin This list may not describe all possible interactions. Give your health care provider a list of all the medicines, herbs, non-prescription drugs, or dietary supplements you use. Also tell them if you smoke, drink alcohol, or use illegal drugs. Some items may interact with your medicine. What should I watch for while using this medicine? Tell your doctor or health care professional if your symptoms do not improve. Drink several glasses of water a day to reduce the risk of kidney problems. Do not treat diarrhea with over the counter products. Contact your doctor if you have diarrhea that lasts more than 2 days or if it is severe and watery. This medicine can make you more sensitive to the sun. Keep out of the sun. If you cannot avoid being in the sun, wear protective clothing and use a sunscreen. Do not use sun lamps or tanning beds/booths. What side effects may I notice from receiving this medicine? Side effects that you should report to your doctor or health care professional as soon as possible: -allergic reactions like skin rash or hives, swelling of the face, lips, or tongue -breathing problems -fever or chills, sore throat -irregular heartbeat, chest pain -  joint or muscle pain -pain  or difficulty passing urine -red pinpoint spots on skin -redness, blistering, peeling or loosening of the skin, including inside the mouth -unusual bleeding or bruising -unusual weakness or tiredness -yellowing of the eyes or skin Side effects that usually do not require medical attention (report to your doctor or health care professional if they continue or are bothersome): -diarrhea -dizziness -headache -loss of appetite -nausea, vomiting -nervousness This list may not describe all possible side effects. Call your doctor for medical advice about side effects. You may report side effects to FDA at 1-800-FDA-1088. Where should I keep my medicine? Keep out of the reach of children. Store at room temperature between 15 and 25 degrees C (59 and 77 degrees F). Protect from light and moisture. Throw away any unused medicine after the expiration date. NOTE: This sheet is a summary. It may not cover all possible information. If you have questions about this medicine, talk to your doctor, pharmacist, or health care provider.  2018 Elsevier/Gold Standard (2012-08-04 14:37:40)    Buspirone tablets What is this medicine? BUSPIRONE (byoo SPYE rone) is used to treat anxiety disorders. This medicine may be used for other purposes; ask your health care provider or pharmacist if you have questions. COMMON BRAND NAME(S): BuSpar What should I tell my health care provider before I take this medicine? They need to know if you have any of these conditions: -kidney or liver disease -an unusual or allergic reaction to buspirone, other medicines, foods, dyes, or preservatives -pregnant or trying to get pregnant -breast-feeding How should I use this medicine? Take this medicine by mouth with a glass of water. Follow the directions on the prescription label. You may take this medicine with or without food. To ensure that this medicine always works the same way for you, you should take it either always with  or always without food. Take your doses at regular intervals. Do not take your medicine more often than directed. Do not stop taking except on the advice of your doctor or health care professional. Talk to your pediatrician regarding the use of this medicine in children. Special care may be needed. Overdosage: If you think you have taken too much of this medicine contact a poison control center or emergency room at once. NOTE: This medicine is only for you. Do not share this medicine with others. What if I miss a dose? If you miss a dose, take it as soon as you can. If it is almost time for your next dose, take only that dose. Do not take double or extra doses. What may interact with this medicine? Do not take this medicine with any of the following medications: -linezolid -MAOIs like Carbex, Eldepryl, Marplan, Nardil, and Parnate -methylene blue -procarbazine This medicine may also interact with the following medications: -diazepam -digoxin -diltiazem -erythromycin -grapefruit juice -haloperidol -medicines for mental depression or mood problems -medicines for seizures like carbamazepine, phenobarbital and phenytoin -nefazodone -other medications for anxiety -rifampin -ritonavir -some antifungal medicines like itraconazole, ketoconazole, and voriconazole -verapamil -warfarin This list may not describe all possible interactions. Give your health care provider a list of all the medicines, herbs, non-prescription drugs, or dietary supplements you use. Also tell them if you smoke, drink alcohol, or use illegal drugs. Some items may interact with your medicine. What should I watch for while using this medicine? Visit your doctor or health care professional for regular checks on your progress. It may take 1 to 2 weeks before your anxiety gets  better. You may get drowsy or dizzy. Do not drive, use machinery, or do anything that needs mental alertness until you know how this drug affects you.  Do not stand or sit up quickly, especially if you are an older patient. This reduces the risk of dizzy or fainting spells. Alcohol can make you more drowsy and dizzy. Avoid alcoholic drinks. What side effects may I notice from receiving this medicine? Side effects that you should report to your doctor or health care professional as soon as possible: -blurred vision or other vision changes -chest pain -confusion -difficulty breathing -feelings of hostility or anger -muscle aches and pains -numbness or tingling in hands or feet -ringing in the ears -skin rash and itching -vomiting -weakness Side effects that usually do not require medical attention (report to your doctor or health care professional if they continue or are bothersome): -disturbed dreams, nightmares -headache -nausea -restlessness or nervousness -sore throat and nasal congestion -stomach upset This list may not describe all possible side effects. Call your doctor for medical advice about side effects. You may report side effects to FDA at 1-800-FDA-1088. Where should I keep my medicine? Keep out of the reach of children. Store at room temperature below 30 degrees C (86 degrees F). Protect from light. Keep container tightly closed. Throw away any unused medicine after the expiration date. NOTE: This sheet is a summary. It may not cover all possible information. If you have questions about this medicine, talk to your doctor, pharmacist, or health care provider.  2018 Elsevier/Gold Standard (2009-08-07 18:06:11)    Sulfamethoxazole; Trimethoprim, SMX-TMP oral suspension What is this medicine? SULFAMETHOXAZOLE; TRIMETHOPRIM or SMX-TMP (suhl fuh meth OK suh zohl; trye METH oh prim) is a combination of a sulfonamide antibiotic and a second antibiotic, trimethoprim. It is used to treat or prevent certain kinds of bacterial infections.It will not work for colds, flu, or other viral infections. This medicine may be used for  other purposes; ask your health care provider or pharmacist if you have questions. COMMON BRAND NAME(S): Septra, Sulfatrim, Sulfatrim Pediatric, Sultrex Pediatric What should I tell my health care provider before I take this medicine? They need to know if you have any of these conditions: -anemia -asthma -being treated with anticonvulsants -if you frequently drink alcohol containing drinks -kidney disease -liver disease -low level of folic acid or [id number] dehydrogenase -poor nutrition or malabsorption -porphyria -severe allergies -thyroid disorder -an unusual or allergic reaction to sulfamethoxazole, trimethoprim, sulfa drugs, other medicines, foods, dyes, or preservatives -pregnant or trying to get pregnant -breast-feeding How should I use this medicine? Take this suspension by mouth. Follow the directions on the prescription label. Shake the bottle well before taking. Use a specially marked spoon or container to measure your medicine. Ask your pharmacist if you do not have one. Household spoons are not accurate. Take your doses at regular intervals. Do not take more medicine than directed. Talk to your pediatrician regarding the use of this medicine in children. Special care may be needed. While this drug may be prescribed for children as young as 5 months of age for selected conditions, precautions do apply. Overdosage: If you think you have taken too much of this medicine contact a poison control center or emergency room at once. NOTE: This medicine is only for you. Do not share this medicine with others. What if I miss a dose? If you miss a dose, take it as soon as you can. If it is almost time for your next dose, take  only that dose. Do not take double or extra doses. What may interact with this medicine? Do not take this medicine with any of the following medications -aminobenzoate potassium -dofetilide -metronidazole This medicine may also interact with the following  medications -ACE inhibitors like benazepril, enalapril, lisinopril, and ramipril -birth control pills -cyclosporine -digoxin -diuretics -indomethacin -medicines for diabetes -methenamine -methotrexate -phenytoin -potassium supplements -pyrimethamine -sulfinpyrazone -tricyclic antidepressants -warfarin This list may not describe all possible interactions. Give your health care provider a list of all the medicines, herbs, non-prescription drugs, or dietary supplements you use. Also tell them if you smoke, drink alcohol, or use illegal drugs. Some items may interact with your medicine. What should I watch for while using this medicine? Tell your doctor or health care professional if your symptoms do not improve. Drink several glasses of water a day to reduce the risk of kidney problems. Do not treat diarrhea with over the counter products. Contact your doctor if you have diarrhea that lasts more than 2 days or if it is severe and watery. This medicine can make you more sensitive to the sun. Keep out of the sun. If you cannot avoid being in the sun, wear protective clothing and use a sunscreen. Do not use sun lamps or tanning beds/booths. What side effects may I notice from receiving this medicine? Side effects that you should report to your doctor or health care professional as soon as possible: -allergic reactions like skin rash or hives, swelling of the face, lips, or tongue -breathing problems -fever or chills, sore throat -irregular heartbeat, chest pain -joint or muscle pain -pain or difficulty passing urine -red pinpoint spots on skin -redness, blistering, peeling or loosening of the skin, including inside the mouth -unusual bleeding or bruising -unusual weakness or tiredness -yellowing of the eyes or skin Side effects that usually do not require medical attention (report to your doctor or health care professional if they continue or are  bothersome): -diarrhea -dizziness -headache -loss of appetite -nausea, vomiting -nervousness This list may not describe all possible side effects. Call your doctor for medical advice about side effects. You may report side effects to FDA at 1-800-FDA-1088. Where should I keep my medicine? Keep out of the reach of children. Store at room temperature between 15 and 25 degrees C (59 and 77 degrees F). Protect from light and moisture. Throw away any unused medicine after the expiration date. NOTE: This sheet is a summary. It may not cover all possible information. If you have questions about this medicine, talk to your doctor, pharmacist, or health care provider.  2018 Elsevier/Gold Standard (2012-08-04 14:37:40)

## 2017-11-01 NOTE — Progress Notes (Signed)
Follow Up  Subjective:    Patient ID: Pamela Wilkinson, female    DOB: 06/06/1975, 42 y.o.   MRN: [id number]   Chief Complaint  Patient presents with  . Abdominal Pain  . Arm Pain    left   HPI  Pamela Wilkinson is a 42 year old female with no significant past medical history. She is here today for follow up.    Current Status: Since her last office visit, she is doing well with no complaints.   She has been have generalized abdominal pain continuously for the past 2 years. She continues to have pain in her left breast area, despite negative Mammogram last month. She is very tearful today. She admits that her husband is asking for a divorce. Her father recently died a few months ago. Her anxiety/depression has increased because of these stressors. She states that she does have an Aunt that she has been confiding in about her marital situation, and she has been praying for a change. She admits that her symptoms of chest discomfort and abdominal pain increase when she thinks of these things. No reports of GI problems such as nausea, vomiting, diarrhea, and constipation. She has no reports of blood in stools, dysuria and hematuria.She denies suicidal ideations, homicidal ideations, or auditory hallucinations.  She denies fevers, chills, fatigue, recent infections, weight loss, and night sweats. She has not had any headaches, visual changes, dizziness, and falls. No chest pain, heart palpitations, cough and shortness of breath reported.  She denies pain today.   Past Medical History:  Diagnosis Date  . Situational anxiety     Family History  Problem Relation Age of Onset  . Hypertension Mother   . Heart disease Maternal Aunt     Social History   Socioeconomic History  . Marital status: Married    Spouse name: Not on file  . Number of children: Not on file  . Years of education: Not on file  . Highest education level: Not on file  Occupational History  . Not on file   Social Needs  . Financial resource strain: Not on file  . Food insecurity:    Worry: Not on file    Inability: Not on file  . Transportation needs:    Medical: Not on file    Non-medical: Not on file  Tobacco Use  . Smoking status: Never Smoker  . Smokeless tobacco: Never Used  Substance and Sexual Activity  . Alcohol use: Not Currently  . Drug use: No  . Sexual activity: Yes    Birth control/protection: None  Lifestyle  . Physical activity:    Days per week: Not on file    Minutes per session: Not on file  . Stress: Not on file  Relationships  . Social connections:    Talks on phone: Not on file    Gets together: Not on file    Attends religious service: Not on file    Active member of club or organization: Not on file    Attends meetings of clubs or organizations: Not on file    Relationship status: Not on file  . Intimate partner violence:    Fear of current or ex partner: Not on file    Emotionally abused: Not on file    Physically abused: Not on file    Forced sexual activity: Not on file  Other Topics Concern  . Not on file  Social History Narrative  . Not on file  History reviewed. No pertinent surgical history.   Immunization History  Administered Date(s) Administered  . Influenza,inj,Quad PF,6+ Mos 02/06/2013, 12/22/2015, 11/01/2017    No outpatient medications have been marked as taking for the 11/01/17 encounter (Office Visit) with Kallie Locks, FNP.    No Known Allergies  BP 116/68 (BP Location: Left Arm, Patient Position: Sitting, Cuff Size: Small)   Pulse 76   Temp 98 F (36.7 C) (Oral)   Ht 5' (1.524 m)   Wt 113 lb (51.3 kg)   LMP 10/31/2017   SpO2 100%   BMI 22.07 kg/m    Review of Systems  Constitutional: Positive for fatigue (mildly).  HENT: Negative.   Eyes: Negative.   Respiratory: Negative.   Cardiovascular:       Chest discomfort  Gastrointestinal: Negative.   Musculoskeletal: Negative.   Skin: Negative.    Psychiatric/Behavioral: Negative.    Objective:   Physical Exam  Constitutional: She appears well-developed and well-nourished.  HENT:  Head: Normocephalic and atraumatic.  Eyes: Pupils are equal, round, and reactive to light. EOM are normal.  Cardiovascular: Normal rate and normal heart sounds.  Pulmonary/Chest: Effort normal and breath sounds normal.  Abdominal: Soft. Normal appearance and normal aorta. There is generalized tenderness.  Nursing note and vitals reviewed.  Assessment & Plan:   1. Situational anxiety We will initiate Buspar today. She will consider Psychiatry referral and discuss at next office visit. She will continue to confide with her Aunt. Monitor.  - busPIRone (BUSPAR) 5 MG tablet; Take 1 tablet (5 mg total) by mouth 2 (two) times daily.  Dispense: 60 tablet; Refill: 1  2. Discomfort in chest Probable r/t her anxiety. ECG today was negative. Mammogram on 09/29/2017 negative. Lengthy discussion today about how anxiety can cause physical signs. We will continue to monitor.   3. History of mammogram Mammogram on 09/29/2017 negative.  4. Crying Very emotional today. Offered Psych referral and advised to continue confiding in her Aunt and church members for additional support.   5. Fatigue, unspecified type Increased. Probable r/t increased anxiety.   6. Abdominal discomfort, generalized Stable today. Probable r/t increased anxiety.   7. Need for immunization against influenza - Flu Vaccine QUAD 36+ mos IM  8. Screening for diabetes mellitus Hgb A1c is within normal levels at 4.8 today. She will continue to decrease foods/beverages high in sugars and carbs and follow Heart Healthy or DASH diet. Increase physical activity to at least 30 minutes cardio exercise daily.  - POCT glycosylated hemoglobin (Hb A1C) - POCT urinalysis dipstick  9. Abnormal urinalysis  We will order Urine Culture today.   10. Urinary tract infection Rx for Septra to pharmacy today.    11. Follow up She will follow up in 2 weeks.   Meds ordered this encounter  Medications  . busPIRone (BUSPAR) 5 MG tablet    Sig: Take 1 tablet (5 mg total) by mouth 2 (two) times daily.    Dispense:  60 tablet    Refill:  1   Raliegh Ip,  MSN, FNP-C Patient [facility] [address] [place], [place] [address] [phone]

## 2017-11-02 ENCOUNTER — Telehealth: Payer: Self-pay

## 2017-11-02 NOTE — Telephone Encounter (Signed)
Tried to contact patient with interpreter but there was no answer and no vm.

## 2017-11-02 NOTE — Telephone Encounter (Signed)
-----   Message from Natalie M Stroud, FNP sent at 11/01/2017  4:57 PM EDT ----- Regarding: "UTI" Carrie,  Please inform patient that Rx for Septra was sent to pharmacy today. Patient needs Hispanic interpreter.   Thank you. 

## 2017-11-03 NOTE — Telephone Encounter (Signed)
-----   Message from Kallie Locks, FNP sent at 11/01/2017  4:57 PM EDT ----- Regarding: "UTI" Pamela Wilkinson,  Please inform patient that Rx for Septra was sent to pharmacy today. Patient needs Hispanic interpreter.   Thank you.

## 2017-11-03 NOTE — Telephone Encounter (Signed)
Tried to contact patient with interpreter but no answer and no vm set up.

## 2017-11-04 NOTE — Telephone Encounter (Signed)
Unable to reach by phone and a letter will be sent out for patient to contact office.

## 2017-11-04 NOTE — Telephone Encounter (Signed)
-----   Message from Natalie M Stroud, FNP sent at 11/01/2017  4:57 PM EDT ----- Regarding: "UTI" Carrie,  Please inform patient that Rx for Septra was sent to pharmacy today. Patient needs Hispanic interpreter.   Thank you. 

## 2017-11-07 LAB — URINE CULTURE

## 2017-11-21 ENCOUNTER — Ambulatory Visit (INDEPENDENT_AMBULATORY_CARE_PROVIDER_SITE_OTHER): Payer: Self-pay | Admitting: Family Medicine

## 2017-11-21 ENCOUNTER — Encounter: Payer: Self-pay | Admitting: Family Medicine

## 2017-11-21 VITALS — BP 116/72 | HR 88 | Temp 98.4°F | Wt 112.4 lb

## 2017-11-21 DIAGNOSIS — F418 Other specified anxiety disorders: Secondary | ICD-10-CM

## 2017-11-21 DIAGNOSIS — Z09 Encounter for follow-up examination after completed treatment for conditions other than malignant neoplasm: Secondary | ICD-10-CM

## 2017-11-21 DIAGNOSIS — R4589 Other symptoms and signs involving emotional state: Secondary | ICD-10-CM

## 2017-11-21 DIAGNOSIS — R8290 Unspecified abnormal findings in urine: Secondary | ICD-10-CM

## 2017-11-21 LAB — POCT URINALYSIS DIP (MANUAL ENTRY)
Bilirubin, UA: NEGATIVE
Glucose, UA: NEGATIVE mg/dL
Ketones, POC UA: NEGATIVE mg/dL
Leukocytes, UA: NEGATIVE
Nitrite, UA: NEGATIVE
Protein Ur, POC: NEGATIVE mg/dL
Spec Grav, UA: 1.02 (ref 1.010–1.025)
Urobilinogen, UA: 0.2 E.U./dL
pH, UA: 6 (ref 5.0–8.0)

## 2017-11-21 MED ORDER — BUSPIRONE HCL 7.5 MG PO TABS: 7.5000 mg | ORAL_TABLET | Freq: Two times a day (BID) | ORAL | 3 refills | Status: DC

## 2017-11-21 NOTE — Progress Notes (Signed)
Follow Up  Subjective:    Patient ID: Pamela Wilkinson, female    DOB: 06-13-1975, 42 y.o.   MRN: [id number]   Chief Complaint  Patient presents with  . Follow-up    Anxiety    HPI  Pamela Wilkinson is a 42 year old female with a past medical history of Situational Anxiety. She is here today for follow up.   Current Status: Since her last office visit, she is doing better since beginning Buspar. She states that she still has some days in which her anxiety is increased. She has one incident of a few minutes of crying today as she discussing her situation. She denies suicidal ideations, homicidal ideations, or auditory hallucinations.   She denies fevers, chills, fatigue, recent infections, weight loss, and night sweats. She has not had any headaches, visual changes, dizziness, and falls. No chest pain, heart palpitations, cough and shortness of breath reported. No reports of GI problems such as nausea, vomiting, diarrhea, and constipation. She has no reports of blood in stools, dysuria and hematuria. She denies pain today.   Review of Systems  Constitutional: Negative.   HENT: Negative.   Eyes: Negative.   Respiratory: Negative.   Cardiovascular: Negative.   Gastrointestinal: Negative.   Endocrine: Negative.   Genitourinary: Negative.   Musculoskeletal: Negative.   Skin: Negative.   Allergic/Immunologic: Negative.   Neurological: Negative.   Hematological: Negative.   Psychiatric/Behavioral: Negative.        Anxiety has improved.    Objective:   Physical Exam  Constitutional: She is oriented to person, place, and time. She appears well-developed and well-nourished.  HENT:  Head: Normocephalic and atraumatic.  Eyes: Pupils are equal, round, and reactive to light. Conjunctivae and EOM are normal.  Neck: Normal range of motion. Neck supple.  Cardiovascular: Normal rate, regular rhythm, normal heart sounds and intact distal pulses.  Pulmonary/Chest: Effort normal and  breath sounds normal.  Abdominal: Soft. Bowel sounds are normal.  Musculoskeletal: Normal range of motion.  Neurological: She is alert and oriented to person, place, and time.  Skin: Skin is warm and dry.  Psychiatric: She has a normal mood and affect. Her behavior is normal. Judgment and thought content normal.  Nursing note and vitals reviewed.   Assessment & Plan:   1. Situational anxiety Improved. We will increase dosage of Buspar today. Continue to take as prescribe. She will continue family, church, and friend support.  - busPIRone (BUSPAR) 7.5 MG tablet; Take 1 tablet (7.5 mg total) by mouth 2 (two) times daily.  Dispense: 60 tablet; Refill: 3  2. Crying Effective. We will increase Buspar to 7.5 today. Monitor.  - busPIRone (BUSPAR) 7.5 MG tablet; Take 1 tablet (7.5 mg total) by mouth 2 (two) times daily.  Dispense: 60 tablet; Refill: 3  3. Abnormal urinalysis - Urine Culture  4. Follow up She will follow up in 3 months.  - POCT urinalysis dipstick  Meds ordered this encounter  Medications  . busPIRone (BUSPAR) 7.5 MG tablet    Sig: Take 1 tablet (7.5 mg total) by mouth 2 (two) times daily.    Dispense:  60 tablet    Refill:  3    Raliegh Ip,  MSN, [facility] Patient [facility] [address] [place], [place] [address] [phone]

## 2017-11-23 LAB — URINE CULTURE: Organism ID, Bacteria: NO GROWTH

## 2017-12-19 ENCOUNTER — Ambulatory Visit (INDEPENDENT_AMBULATORY_CARE_PROVIDER_SITE_OTHER): Payer: Self-pay | Admitting: Family Medicine

## 2017-12-19 ENCOUNTER — Encounter: Payer: Self-pay | Admitting: Family Medicine

## 2017-12-19 VITALS — BP 112/64 | HR 64 | Temp 98.1°F | Ht 60.0 in | Wt 110.8 lb

## 2017-12-19 DIAGNOSIS — R519 Headache, unspecified: Secondary | ICD-10-CM

## 2017-12-19 DIAGNOSIS — Z0000 Encounter for general adult medical examination without abnormal findings: Secondary | ICD-10-CM

## 2017-12-19 DIAGNOSIS — Z09 Encounter for follow-up examination after completed treatment for conditions other than malignant neoplasm: Secondary | ICD-10-CM

## 2017-12-19 DIAGNOSIS — R51 Headache: Secondary | ICD-10-CM

## 2017-12-19 DIAGNOSIS — F419 Anxiety disorder, unspecified: Secondary | ICD-10-CM

## 2017-12-19 DIAGNOSIS — G8929 Other chronic pain: Secondary | ICD-10-CM

## 2017-12-19 DIAGNOSIS — N946 Dysmenorrhea, unspecified: Secondary | ICD-10-CM

## 2017-12-19 DIAGNOSIS — N920 Excessive and frequent menstruation with regular cycle: Secondary | ICD-10-CM

## 2017-12-19 LAB — POCT URINALYSIS DIP (MANUAL ENTRY)
Bilirubin, UA: NEGATIVE
Glucose, UA: NEGATIVE mg/dL
Ketones, POC UA: NEGATIVE mg/dL
Leukocytes, UA: NEGATIVE
Nitrite, UA: POSITIVE — AB
Protein Ur, POC: NEGATIVE mg/dL
Spec Grav, UA: 1.025 (ref 1.010–1.025)
Urobilinogen, UA: 0.2 E.U./dL
pH, UA: 6 (ref 5.0–8.0)

## 2017-12-19 MED ORDER — TOPIRAMATE 25 MG PO TABS: 25.0000 mg | ORAL_TABLET | Freq: Two times a day (BID) | ORAL | 3 refills | Status: DC

## 2017-12-19 MED ORDER — NAPROXEN 500 MG PO TABS: 500.0000 mg | ORAL_TABLET | Freq: Two times a day (BID) | ORAL | 3 refills | Status: DC

## 2017-12-19 MED FILL — NAPROXEN 500 MG TABLET: 500 | 30 days supply | Qty: 60 | Fill #0

## 2017-12-19 MED FILL — TOPIRAMATE 25 MG TABS: 25 | 30 days supply | Qty: 60 | Fill #0

## 2017-12-19 NOTE — Progress Notes (Signed)
Sick Visit  Subjective:    Patient ID: Pamela Wilkinson, female    DOB: [birth year], 42 y.o.   MRN: [id number]   Chief Complaint  Patient presents with  . Headache    2 weeks  . Menstrual Problem    irregular bleeding    HPI  Pamela Wilkinson is a 42 year old female with a past medical history of Anxiety. She is here today for sick visit.   Current Status: Since her last office visit, she has c/o headaches, which she takes Acetaminophen that is not effective. she has began to have abdominal cramping and pain. Last menstrual period was last week. She describes her periods as longer and heavier. She has no reports of GI problems such as nausea, vomiting, diarrhea, and constipation. She has no reports of blood in stools, dysuria and hematuria.  She denies fevers, chills, fatigue, recent infections, weight loss, and night sweats. She has not had any visual changes, and falls. No chest pain, heart palpitations, cough and shortness of breath reported.  Her anxiety is better today. She denies pain today.    Review of Systems  Constitutional: Negative.   HENT: Negative.   Eyes: Negative.   Respiratory: Negative.   Cardiovascular: Negative.   Gastrointestinal: Negative.        Menorrhagia Increased menstrual cramps.   Endocrine: Negative.   Genitourinary: Negative.   Musculoskeletal: Negative.   Skin: Negative.   Allergic/Immunologic: Negative.   Neurological: Negative.   Hematological: Negative.   Psychiatric/Behavioral: Negative.    Objective:   Physical Exam  Constitutional: She is oriented to person, place, and time. She appears well-developed and well-nourished.  HENT:  Head: Normocephalic and atraumatic.  Mouth/Throat: Oropharynx is clear and moist.  Eyes: Pupils are equal, round, and reactive to light. EOM are normal.  Neck: Normal range of motion. Neck supple.  Cardiovascular: Normal rate, regular rhythm, normal heart sounds and intact distal pulses.   Pulmonary/Chest: Effort normal and breath sounds normal.  Abdominal: Soft. Bowel sounds are normal.  Musculoskeletal: Normal range of motion.  Neurological: She is alert and oriented to person, place, and time.  Skin: Skin is warm and dry.  Psychiatric: She has a normal mood and affect. Her behavior is normal.  Nursing note and vitals reviewed.  Assessment & Plan:   1. Chronic intractable headache, unspecified headache type We will initiate Topamax today. Monitor.  - topiramate (TOPAMAX) 25 MG tablet; Take 1 tablet (25 mg total) by mouth 2 (two) times daily.  Dispense: 60 tablet; Refill: 3  2. Menorrhagia with regular cycle We will initiate Naproxen today. Monitor.  - naproxen (NAPROSYN) 500 MG tablet; Take 1 tablet (500 mg total) by mouth 2 (two) times daily with a meal.  Dispense: 60 tablet; Refill: 3 - CBC with Differential  3. Menstrual cramps - naproxen (NAPROSYN) 500 MG tablet; Take 1 tablet (500 mg total) by mouth 2 (two) times daily with a meal.  Dispense: 60 tablet; Refill: 3  4. Anxiety Much better. She continues to confide in family and church members on situational anxiety.   5. Healthcare maintenance - Comprehensive metabolic panel  6. Follow up She will keep previously scheduled follow up appointment.  - POCT urinalysis dipstick  Meds ordered this encounter  Medications  . naproxen (NAPROSYN) 500 MG tablet    Sig: Take 1 tablet (500 mg total) by mouth 2 (two) times daily with a meal.    Dispense:  60 tablet    Refill:  3  .  topiramate (TOPAMAX) 25 MG tablet    Sig: Take 1 tablet (25 mg total) by mouth 2 (two) times daily.    Dispense:  60 tablet    Refill:  3    Raliegh IpNatalie Shanita Kanan,  MSN, [facility] Patient [facility] [address]  [place], [place] [id number] [phone]

## 2017-12-19 NOTE — Patient Instructions (Addendum)
Naproxen Sodium oral tablet, extended-release What is this medicine? NAPROXEN (na PROX en) is a non-steroidal anti-inflammatory drug (NSAID). It is used to reduce swelling and to treat pain. This medicine may be used for dental pain, headache, or painful monthly periods. It is also used for painful joint and muscular problems such as arthritis, tendinitis, bursitis, and gout. This medicine may be used for other purposes; ask your health care provider or pharmacist if you have questions. COMMON BRAND NAME(S): Midol Extended Relief, Naprelan Dose Card What should I tell my health care provider before I take this medicine? They need to know if you have any of these conditions: -asthma -cigarette smoker -drink more than 3 alcohol containing drinks a day -heart disease or circulation problems such as heart failure or leg edema (fluid retention) -high blood pressure -kidney disease -liver disease -stomach bleeding or ulcers -an unusual or allergic reaction to naproxen, aspirin, other NSAIDs, other medicines, foods, dyes, or preservatives -pregnant or trying to get pregnant -breast-feeding How should I use this medicine? Take this medicine by mouth with a glass of water. Follow the directions on the prescription label. Take this medicine with food if it upsets your stomach. Try to not lie down for at least 10 minutes after you take it. Take your medicine at regular intervals. Do not take your medicine more often than directed. Long-term, continuous use may increase the risk of heart attack or stroke. A special MedGuide will be given to you by the pharmacist with each prescription and refill. Be sure to read this information carefully each time. Talk to your pediatrician regarding the use of this medicine in children. Special care may be needed. Overdosage: If you think you have taken too much of this medicine contact a poison control center or emergency room at once. NOTE: This medicine is only for  you. Do not share this medicine with others. What if I miss a dose? If you miss a dose, take it as soon as you can. If it is almost time for your next dose, take only that dose. Do not take double or extra doses. What may interact with this medicine? -alcohol -aspirin -cidofovir -diuretics -lithium -methotrexate -other drugs for inflammation like ketorolac or prednisone -pemetrexed -probenecid -warfarin This list may not describe all possible interactions. Give your health care provider a list of all the medicines, herbs, non-prescription drugs, or dietary supplements you use. Also tell them if you smoke, drink alcohol, or use illegal drugs. Some items may interact with your medicine. What should I watch for while using this medicine? Tell your doctor or health care professional if your pain does not get better. Talk to your doctor before taking another medicine for pain. Do not treat yourself. This medicine does not prevent heart attack or stroke. In fact, this medicine may increase the chance of a heart attack or stroke. The chance may increase with longer use of this medicine and in people who have heart disease. If you take aspirin to prevent heart attack or stroke, talk with your doctor or health care professional. Do not take other medicines that contain aspirin, ibuprofen, or naproxen with this medicine. Side effects such as stomach upset, nausea, or ulcers may be more likely to occur. Many medicines available without a prescription should not be taken with this medicine. This medicine can cause ulcers and bleeding in the stomach and intestines at any time during treatment. Do not smoke cigarettes or drink alcohol. These increase irritation to your stomach and can   make it more susceptible to damage from this medicine. Ulcers and bleeding can happen without warning symptoms and can cause death. You may get drowsy or dizzy. Do not drive, use machinery, or do anything that needs mental  alertness until you know how this medicine affects you. Do not stand or sit up quickly, especially if you are an older patient. This reduces the risk of dizzy or fainting spells. This medicine can cause you to bleed more easily. Try to avoid damage to your teeth and gums when you brush or floss your teeth. What side effects may I notice from receiving this medicine? Side effects that you should report to your doctor or health care professional as soon as possible: -black or bloody stools, blood in the urine or vomit -blurred vision -chest pain -difficulty breathing or wheezing -nausea or vomiting -severe stomach pain -skin rash, skin redness, blistering or peeling skin, hives, or itching -slurred speech or weakness on one side of the body -swelling of eyelids, throat, lips -unexplained weight gain or swelling -unusually weak or tired -yellowing of eyes or skin Side effects that usually do not require medical attention (report to your doctor or health care professional if they continue or are bothersome): -constipation -headache -heartburn This list may not describe all possible side effects. Call your doctor for medical advice about side effects. You may report side effects to FDA at 1-800-FDA-1088. Where should I keep my medicine? Keep out of the reach of children. Store at room temperature between 20 and 25 degrees C (68 and 77 degrees F). Keep container tightly closed. Throw away any unused medicine after the expiration date. NOTE: This sheet is a summary. It may not cover all possible information. If you have questions about this medicine, talk to your doctor, pharmacist, or health care provider.  2018 Elsevier/Gold Standard (2008-12-30 20:26:54) Topiramate tablets What is this medicine? TOPIRAMATE (toe PYRE a mate) is used to treat seizures in adults or children with epilepsy. It is also used for the prevention of migraine headaches. This medicine may be used for other purposes;  ask your health care provider or pharmacist if you have questions. COMMON BRAND NAME(S): Topamax, Topiragen What should I tell my health care provider before I take this medicine? They need to know if you have any of these conditions: -bleeding disorders -cirrhosis of the liver or liver disease -diarrhea -glaucoma -kidney stones or kidney disease -low blood counts, like low white cell, platelet, or red cell counts -lung disease like asthma, obstructive pulmonary disease, emphysema -metabolic acidosis -on a ketogenic diet -schedule for surgery or a procedure -suicidal thoughts, plans, or attempt; a previous suicide attempt by you or a family member -an unusual or allergic reaction to topiramate, other medicines, foods, dyes, or preservatives -pregnant or trying to get pregnant -breast-feeding How should I use this medicine? Take this medicine by mouth with a glass of water. Follow the directions on the prescription label. Do not crush or chew. You may take this medicine with meals. Take your medicine at regular intervals. Do not take it more often than directed. Talk to your pediatrician regarding the use of this medicine in children. Special care may be needed. While this drug may be prescribed for children as young as 36 years of age for selected conditions, precautions do apply. Overdosage: If you think you have taken too much of this medicine contact a poison control center or emergency room at once. NOTE: This medicine is only for you. Do not share this  medicine with others. What if I miss a dose? If you miss a dose, take it as soon as you can. If your next dose is to be taken in less than 6 hours, then do not take the missed dose. Take the next dose at your regular time. Do not take double or extra doses. What may interact with this medicine? Do not take this medicine with any of the following medications: -probenecid This medicine may also interact with the following  medications: -acetazolamide -alcohol -amitriptyline -aspirin and aspirin-like medicines -birth control pills -certain medicines for depression -certain medicines for seizures -certain medicines that treat or prevent blood clots like warfarin, enoxaparin, dalteparin, apixaban, dabigatran, and rivaroxaban -digoxin -hydrochlorothiazide -lithium -medicines for pain, sleep, or muscle relaxation -metformin -methazolamide -NSAIDS, medicines for pain and inflammation, like ibuprofen or naproxen -pioglitazone -risperidone This list may not describe all possible interactions. Give your health care provider a list of all the medicines, herbs, non-prescription drugs, or dietary supplements you use. Also tell them if you smoke, drink alcohol, or use illegal drugs. Some items may interact with your medicine. What should I watch for while using this medicine? Visit your doctor or health care professional for regular checks on your progress. Do not stop taking this medicine suddenly. This increases the risk of seizures if you are using this medicine to control epilepsy. Wear a medical identification bracelet or chain to say you have epilepsy or seizures, and carry a card that lists all your medicines. This medicine can decrease sweating and increase your body temperature. Watch for signs of deceased sweating or fever, especially in children. Avoid extreme heat, hot baths, and saunas. Be careful about exercising, especially in hot weather. Contact your health care provider right away if you notice a fever or decrease in sweating. You should drink plenty of fluids while taking this medicine. If you have had kidney stones in the past, this will help to reduce your chances of forming kidney stones. If you have stomach pain, with nausea or vomiting and yellowing of your eyes or skin, call your doctor immediately. You may get drowsy, dizzy, or have blurred vision. Do not drive, use machinery, or do anything that  needs mental alertness until you know how this medicine affects you. To reduce dizziness, do not sit or stand up quickly, especially if you are an older patient. Alcohol can increase drowsiness and dizziness. Avoid alcoholic drinks. If you notice blurred vision, eye pain, or other eye problems, seek medical attention at once for an eye exam. The use of this medicine may increase the chance of suicidal thoughts or actions. Pay special attention to how you are responding while on this medicine. Any worsening of mood, or thoughts of suicide or dying should be reported to your health care professional right away. This medicine may increase the chance of developing metabolic acidosis. If left untreated, this can cause kidney stones, bone disease, or slowed growth in children. Symptoms include breathing fast, fatigue, loss of appetite, irregular heartbeat, or loss of consciousness. Call your doctor immediately if you experience any of these side effects. Also, tell your doctor about any surgery you plan on having while taking this medicine since this may increase your risk for metabolic acidosis. Birth control pills may not work properly while you are taking this medicine. Talk to your doctor about using an extra method of birth control. Women who become pregnant while using this medicine may enroll in the [place] American Antiepileptic Drug Pregnancy Registry by calling [phone].  This registry collects information about the safety of antiepileptic drug use during pregnancy. What side effects may I notice from receiving this medicine? Side effects that you should report to your doctor or health care professional as soon as possible: -allergic reactions like skin rash, itching or hives, swelling of the face, lips, or tongue -decreased sweating and/or rise in body temperature -depression -difficulty breathing, fast or irregular breathing patterns -difficulty speaking -difficulty walking or controlling  muscle movements -hearing impairment -redness, blistering, peeling or loosening of the skin, including inside the mouth -tingling, pain or numbness in the hands or feet -unusual bleeding or bruising -unusually weak or tired -worsening of mood, thoughts or actions of suicide or dying Side effects that usually do not require medical attention (report to your doctor or health care professional if they continue or are bothersome): -altered taste -back pain, joint or muscle aches and pains -diarrhea, or constipation -headache -loss of appetite -nausea -stomach upset, indigestion -tremors This list may not describe all possible side effects. Call your doctor for medical advice about side effects. You may report side effects to FDA at 1-800-FDA-1088. Where should I keep my medicine? Keep out of the reach of children. Store at room temperature between 15 and 30 degrees C (59 and 86 degrees F) in a tightly closed container. Protect from moisture. Throw away any unused medicine after the expiration date. NOTE: This sheet is a summary. It may not cover all possible information. If you have questions about this medicine, talk to your doctor, pharmacist, or health care provider.  2018 Elsevier/Gold Standard (2013-01-01 23:17:57)

## 2017-12-20 LAB — CBC WITH DIFFERENTIAL/PLATELET
Basophils Absolute: 0 10*3/uL (ref 0.0–0.2)
Basos: 1 %
EOS (ABSOLUTE): 0.1 10*3/uL (ref 0.0–0.4)
Eos: 3 %
Hematocrit: 37.8 % (ref 34.0–46.6)
Hemoglobin: 12.4 g/dL (ref 11.1–15.9)
Immature Grans (Abs): 0 10*3/uL (ref 0.0–0.1)
Immature Granulocytes: 0 %
Lymphocytes Absolute: 1.4 10*3/uL (ref 0.7–3.1)
Lymphs: 33 %
MCH: 31.2 pg (ref 26.6–33.0)
MCHC: 32.8 g/dL (ref 31.5–35.7)
MCV: 95 fL (ref 79–97)
Monocytes Absolute: 0.4 10*3/uL (ref 0.1–0.9)
Monocytes: 9 %
Neutrophils Absolute: 2.3 10*3/uL (ref 1.4–7.0)
Neutrophils: 54 %
Platelets: 246 10*3/uL (ref 150–450)
RBC: 3.98 x10E6/uL (ref 3.77–5.28)
RDW: 12.7 % (ref 12.3–15.4)
WBC: 4.3 10*3/uL (ref 3.4–10.8)

## 2017-12-20 LAB — COMPREHENSIVE METABOLIC PANEL
ALT: 8 IU/L (ref 0–32)
AST: 12 IU/L (ref 0–40)
Albumin/Globulin Ratio: 2.2 (ref 1.2–2.2)
Albumin: 4.7 g/dL (ref 3.5–5.5)
Alkaline Phosphatase: 51 IU/L (ref 39–117)
BUN/Creatinine Ratio: 16 (ref 9–23)
BUN: 12 mg/dL (ref 6–24)
Bilirubin Total: 0.3 mg/dL (ref 0.0–1.2)
CO2: 21 mmol/L (ref 20–29)
Calcium: 9.2 mg/dL (ref 8.7–10.2)
Chloride: 103 mmol/L (ref 96–106)
Creatinine, Ser: 0.73 mg/dL (ref 0.57–1.00)
GFR calc Af Amer: 117 mL/min/{1.73_m2} (ref >59)
GFR calc non Af Amer: 102 mL/min/{1.73_m2} (ref >59)
Globulin, Total: 2.1 g/dL (ref 1.5–4.5)
Glucose: 50 mg/dL — ABNORMAL LOW (ref 65–99)
Potassium: 3.9 mmol/L (ref 3.5–5.2)
Sodium: 139 mmol/L (ref 134–144)
Total Protein: 6.8 g/dL (ref 6.0–8.5)

## 2017-12-27 ENCOUNTER — Other Ambulatory Visit: Payer: Self-pay | Admitting: Family Medicine

## 2017-12-27 ENCOUNTER — Telehealth: Payer: Self-pay

## 2017-12-27 DIAGNOSIS — R319 Hematuria, unspecified: Secondary | ICD-10-CM

## 2017-12-27 DIAGNOSIS — N390 Urinary tract infection, site not specified: Secondary | ICD-10-CM

## 2017-12-27 MED ORDER — SULFAMETHOXAZOLE-TRIMETHOPRIM 800-160 MG PO TABS: 1.0000 | ORAL_TABLET | Freq: Two times a day (BID) | ORAL | 0 refills | Status: DC

## 2017-12-27 NOTE — Telephone Encounter (Signed)
-----   Message from Kallie LocksNatalie M Stroud, FNP sent at 12/27/2017 10:03 AM EST ----- Regarding: "Lab Results" Vernona RiegerLaura,  (Please use [employer])  Please inform patient that we have sent Rx for Septra to pharmacy today. Urinalysis revealed bacteria. She is to take medication as directed. She is to complete all medication. Keep follow up appointment as scheduled.   Labs also revealed decreased blood glucose level. Please remind patient of the importance of eating 3 meals a day, so that blood glucose levels do not fall too low. If she skips a meal, she should try to eat a snack.   Thank you.

## 2017-12-27 NOTE — Progress Notes (Signed)
New Rx for Septra sent to pharmacy today.  

## 2017-12-27 NOTE — Telephone Encounter (Signed)
Called using interpreter with pacific interpreter [id number]. No answer and voicemail was not set up. Will try to contact again later. Thanks!

## 2017-12-28 NOTE — Telephone Encounter (Signed)
Called using pacific interpreter [id number]. No answer and no voicemail was set up. Will try later. Thanks!

## 2017-12-29 ENCOUNTER — Other Ambulatory Visit: Payer: Self-pay

## 2017-12-29 DIAGNOSIS — N390 Urinary tract infection, site not specified: Secondary | ICD-10-CM

## 2017-12-29 DIAGNOSIS — R319 Hematuria, unspecified: Principal | ICD-10-CM

## 2017-12-29 MED ORDER — SULFAMETHOXAZOLE-TRIMETHOPRIM 800-160 MG PO TABS: 1.0000 | ORAL_TABLET | Freq: Two times a day (BID) | ORAL | 0 refills | Status: DC

## 2017-12-29 MED FILL — SULFAMETHOXAZOLE-TMP DS TAB: 800-160 | 7 days supply | Qty: 14 | Fill #0

## 2017-12-29 NOTE — Telephone Encounter (Signed)
Called and spoke with patient using pac[employer] interpreters ID [id number]. Advised that urine did show bacteria going in it. Advised that she should take septra as directed for 7 days to treat this. Advised that glucose levels were low and that it is important that she eat 3 meals a day and not skip meals. Patient verbalized understanding. Thanks!

## 2018-02-21 ENCOUNTER — Ambulatory Visit: Payer: No Typology Code available for payment source | Admitting: Family Medicine

## 2018-07-12 DIAGNOSIS — E559 Vitamin D deficiency, unspecified: Secondary | ICD-10-CM

## 2018-07-12 HISTORY — DX: Vitamin D deficiency, unspecified: E55.9

## 2018-07-17 ENCOUNTER — Encounter: Payer: Self-pay | Admitting: Family Medicine

## 2018-07-17 ENCOUNTER — Ambulatory Visit: Payer: No Typology Code available for payment source | Admitting: Family Medicine

## 2018-07-17 ENCOUNTER — Ambulatory Visit (INDEPENDENT_AMBULATORY_CARE_PROVIDER_SITE_OTHER): Payer: Self-pay | Admitting: Family Medicine

## 2018-07-17 ENCOUNTER — Other Ambulatory Visit: Payer: Self-pay

## 2018-07-17 VITALS — BP 116/60 | HR 72 | Temp 98.5°F | Ht 60.0 in | Wt 119.2 lb

## 2018-07-17 DIAGNOSIS — F419 Anxiety disorder, unspecified: Secondary | ICD-10-CM | POA: Insufficient documentation

## 2018-07-17 DIAGNOSIS — R519 Headache, unspecified: Secondary | ICD-10-CM | POA: Insufficient documentation

## 2018-07-17 DIAGNOSIS — R51 Headache: Secondary | ICD-10-CM

## 2018-07-17 DIAGNOSIS — J302 Other seasonal allergic rhinitis: Secondary | ICD-10-CM | POA: Insufficient documentation

## 2018-07-17 DIAGNOSIS — G8929 Other chronic pain: Secondary | ICD-10-CM | POA: Insufficient documentation

## 2018-07-17 DIAGNOSIS — Z0000 Encounter for general adult medical examination without abnormal findings: Secondary | ICD-10-CM

## 2018-07-17 DIAGNOSIS — Z09 Encounter for follow-up examination after completed treatment for conditions other than malignant neoplasm: Secondary | ICD-10-CM

## 2018-07-17 DIAGNOSIS — R040 Epistaxis: Secondary | ICD-10-CM | POA: Insufficient documentation

## 2018-07-17 DIAGNOSIS — T148XXA Other injury of unspecified body region, initial encounter: Secondary | ICD-10-CM | POA: Insufficient documentation

## 2018-07-17 LAB — POCT URINALYSIS DIP (MANUAL ENTRY)
Bilirubin, UA: NEGATIVE
Glucose, UA: NEGATIVE mg/dL
Ketones, POC UA: NEGATIVE mg/dL
Leukocytes, UA: NEGATIVE
Nitrite, UA: NEGATIVE
Protein Ur, POC: NEGATIVE mg/dL
Spec Grav, UA: 1.015 (ref 1.010–1.025)
Urobilinogen, UA: 0.2 E.U./dL
pH, UA: 5.5 (ref 5.0–8.0)

## 2018-07-17 MED ORDER — CETIRIZINE HCL 10 MG PO TABS: 10.0000 mg | ORAL_TABLET | Freq: Every day | ORAL | 11 refills | Status: DC

## 2018-07-17 MED ORDER — BUTALBITAL-APAP-CAFFEINE 50-325-40 MG PO TABS: 1.0000 | ORAL_TABLET | Freq: Four times a day (QID) | ORAL | 0 refills | Status: AC | PRN

## 2018-07-17 MED FILL — ?CETIRIZINE HCL 10 MG TABLE: 10 | 30 days supply | Qty: 30 | Fill #0

## 2018-07-17 MED FILL — BUTALB-ACETAMIN-CAFF 50-325: 50-325-40 | 2 days supply | Qty: 20 | Fill #0

## 2018-07-17 NOTE — Progress Notes (Signed)
[facility]    Established Patient Office Visit  Subjective:  Patient ID: Pamela Wilkinson, female    DOB: 1975-12-10  Age: 43 y.o. MRN: [id number]  CC:  Chief Complaint  Patient presents with  . Follow-up    headaches, nose bleeds  . Anxiety    much better    HPI Pamela Wilkinson is a 43 year old female who presents for follow up today.   Past Medical History:  Diagnosis Date  . Situational anxiety    Current Status: Since her last office visit, she is doing well with no complaints. She has c/o headaches, which she takes Acetaminophen and Topamax with minimal relief. She denies visual changes, chest pain, cough, shortness of breath, heart palpitations, and falls. She has occasional headaches and dizziness with position changes. Denies severe headaches, confusion, seizures, double vision, and blurred vision, nausea and vomiting. She has occasional scant nose bleeds, for up to 1-2 times a day, X 3 weeks. Her last menstrual period was 07/03/2018. We communicated via web Hispanic Interpreter today.  She denies fevers, chills, fatigue, recent infections, weight loss, and night sweats. She has not had any visual changes, dizziness, and falls. No chest pain, heart palpitations, cough and shortness of breath reported. No reports of GI problems such as nausea, vomiting, diarrhea, and constipation. She has no reports of blood in stools, dysuria and hematuria. No depression or anxiety, and denies suicidal ideations, homicidal ideations, or auditory hallucinations. She denies pain today.   No past surgical history on file.  Family History  Problem Relation Age of Onset  . Hypertension Mother   . Heart disease Maternal Aunt     Social History   Socioeconomic History  . Marital status: Married    Spouse name: Not on file  . Number of children: Not on file  . Years of education: Not on file  . Highest education  level: Not on file  Occupational History  . Not on file  Social Needs  . Financial resource strain: Not on file  . Food insecurity    Worry: Not on file    Inability: Not on file  . Transportation needs    Medical: Not on file    Non-medical: Not on file  Tobacco Use  . Smoking status: Never Smoker  . Smokeless tobacco: Never Used  Substance and Sexual Activity  . Alcohol use: Not Currently  . Drug use: No  . Sexual activity: Yes    Birth control/protection: None  Lifestyle  . Physical activity    Days per week: Not on file    Minutes per session: Not on file  . Stress: Not on file  Relationships  . Social Herbalist on phone: Not on file    Gets together: Not on file    Attends religious service: Not on file    Active member of club or organization: Not on file    Attends meetings of clubs or organizations: Not on file    Relationship status: Not on file  . Intimate partner violence    Fear of current or ex partner: Not on file    Emotionally abused: Not on file    Physically abused: Not on file    Forced sexual activity: Not on file  Other Topics Concern  . Not on file  Social History Narrative  . Not on file    Outpatient Medications Prior to Visit  Medication Sig Dispense Refill  . acetaminophen (TYLENOL) 500 MG tablet Take 500 mg by mouth every 6 (six) hours as needed for headache.    . busPIRone (BUSPAR) 7.5 MG tablet Take 1 tablet (7.5 mg total) by mouth 2 (two) times daily. 60 tablet 3  . topiramate (TOPAMAX) 25 MG tablet Take 1 tablet (25 mg total) by mouth 2 (two) times daily. 60 tablet 3  . ferrous sulfate (FERROUSUL) 325 (65 FE) MG tablet Take 1 tablet (325 mg total) by mouth 3 (three) times daily with meals. (Patient not taking: Reported on 07/17/2018) 90 tablet 3  . naproxen (NAPROSYN) 500 MG tablet Take 1 tablet (500 mg total) by mouth 2 (two) times daily with a meal. (Patient not taking: Reported on 07/17/2018) 60 tablet 3  .  sulfamethoxazole-trimethoprim (BACTRIM DS,SEPTRA DS) 800-160 MG tablet Take 1 tablet by mouth 2 (two) times daily. 14 tablet 0  . traMADol (ULTRAM) 50 MG tablet Take 1 tablet (50 mg total) by mouth every 6 (six) hours as needed. (Patient not taking: Reported on 06/13/2017) 6 tablet 0   No facility-administered medications prior to visit.     No Known Allergies  ROS Review of Systems    Objective:    Physical Exam  BP 116/60 (BP Location: Left Arm, Patient Position: Sitting, Cuff Size: Small)   Pulse 72   Temp 98.5 F (36.9 C) (Oral)   Ht 5' (1.524 m)   Wt 119 lb 3.2 oz (54.1 kg)   LMP 07/09/2018   SpO2 100%   BMI 23.28 kg/m  Wt Readings from Last 3 Encounters:  07/17/18 119 lb 3.2 oz (54.1 kg)  12/19/17 110 lb 12.8 oz (50.3 kg)  11/21/17 112 lb 6.4 oz (51 kg)     Health Maintenance Due  Topic Date Due  . HIV Screening  04/20/1990    There are no preventive care reminders to display for this patient.  Lab Results  Component Value Date   TSH 2.840 06/13/2017   Lab Results  Component Value Date   WBC 4.3 12/19/2017   HGB 12.4 12/19/2017   HCT 37.8 12/19/2017   MCV 95 12/19/2017   PLT 246 12/19/2017   Lab Results  Component Value Date   NA 139 12/19/2017   K 3.9 12/19/2017   CO2 21 12/19/2017   GLUCOSE 50 (L) 12/19/2017   BUN 12 12/19/2017   CREATININE 0.73 12/19/2017   BILITOT 0.3 12/19/2017   ALKPHOS 51 12/19/2017   AST 12 12/19/2017   ALT 8 12/19/2017   PROT 6.8 12/19/2017   ALBUMIN 4.7 12/19/2017   CALCIUM 9.2 12/19/2017   ANIONGAP 9 01/19/2016   Lab Results  Component Value Date   CHOL 199 06/13/2017   Lab Results  Component Value Date   HDL 69 06/13/2017   Lab Results  Component Value Date   LDLCALC 115 (H) 06/13/2017   Lab Results  Component Value Date   TRIG 76 06/13/2017   Lab Results  Component Value Date   CHOLHDL 2.9 06/13/2017   Lab Results  Component Value Date   HGBA1C 4.8 11/01/2017      Assessment & Plan:   1.  Chronic intractable headache, unspecified headache type We will initiate Fioricet today.  - butalbital-acetaminophen-caffeine (FIORICET) 50-325-40 MG tablet; Take 1-2 tablets by mouth every 6 (six) hours as needed for headache.  Dispense: 20 tablet; Refill: 0  2. Seasonal allergies - cetirizine (ZYRTEC) 10 MG tablet; Take 1 tablet (10 mg total) by mouth  daily.  Dispense: 30 tablet; Refill: 11  3. Bleeding nose Scant. We will evaluate platelet level today.   4. Bruising Scattered bruises.  5. Anxiety  6. Healthcare maintenance - CBC with Differential - Comprehensive metabolic panel - TSH - Lipid Panel - Vitamin D, 25-hydroxy - Vitamin B12  7. Follow up She will follow up in 1-2 weeks for Pap Smear. - POCT urinalysis dipstick   Meds ordered this encounter  Medications  . butalbital-acetaminophen-caffeine (FIORICET) 50-325-40 MG tablet    Sig: Take 1-2 tablets by mouth every 6 (six) hours as needed for headache.    Dispense:  20 tablet    Refill:  0  . cetirizine (ZYRTEC) 10 MG tablet    Sig: Take 1 tablet (10 mg total) by mouth daily.    Dispense:  30 tablet    Refill:  11    Orders Placed This Encounter  Procedures  . CBC with Differential  . Comprehensive metabolic panel  . TSH  . Lipid Panel  . Vitamin D, 25-hydroxy  . Vitamin B12  . POCT urinalysis dipstick    Referral Orders  No referral(s) requested today    Raliegh IpNatalie Wymon Swaney,  MSN, FNP-BC [facility] [address]  [place], [place] [address] [phone]  Problem List Items Addressed This Visit    None    Visit Diagnoses    Chronic intractable headache, unspecified headache type    -  Primary   Relevant Medications   acetaminophen (TYLENOL) 500 MG tablet   butalbital-acetaminophen-caffeine (FIORICET) 50-325-40 MG tablet   Seasonal allergies       Relevant Medications   cetirizine (ZYRTEC) 10 MG tablet   Bleeding nose       Bruising       Anxiety        Healthcare maintenance       Relevant Orders   CBC with Differential   Comprehensive metabolic panel   TSH   Lipid Panel   Vitamin D, 25-hydroxy   Vitamin B12   Follow up       Relevant Orders   POCT urinalysis dipstick (Completed)      Meds ordered this encounter  Medications  . butalbital-acetaminophen-caffeine (FIORICET) 50-325-40 MG tablet    Sig: Take 1-2 tablets by mouth every 6 (six) hours as needed for headache.    Dispense:  20 tablet    Refill:  0  . cetirizine (ZYRTEC) 10 MG tablet    Sig: Take 1 tablet (10 mg total) by mouth daily.    Dispense:  30 tablet    Refill:  11    Follow-up: Return in about 1 week (around 07/24/2018).    Kallie LocksNatalie M Leiliana Foody, FNP

## 2018-07-18 ENCOUNTER — Other Ambulatory Visit: Payer: Self-pay | Admitting: Family Medicine

## 2018-07-18 ENCOUNTER — Encounter: Payer: Self-pay | Admitting: Family Medicine

## 2018-07-18 DIAGNOSIS — E559 Vitamin D deficiency, unspecified: Secondary | ICD-10-CM

## 2018-07-18 LAB — CBC WITH DIFFERENTIAL/PLATELET
Basophils Absolute: 0.1 10*3/uL (ref 0.0–0.2)
Basos: 1 %
EOS (ABSOLUTE): 0.1 10*3/uL (ref 0.0–0.4)
Eos: 2 %
Hematocrit: 35 % (ref 34.0–46.6)
Hemoglobin: 11.1 g/dL (ref 11.1–15.9)
Immature Grans (Abs): 0 10*3/uL (ref 0.0–0.1)
Immature Granulocytes: 0 %
Lymphocytes Absolute: 1.1 10*3/uL (ref 0.7–3.1)
Lymphs: 30 %
MCH: 28.7 pg (ref 26.6–33.0)
MCHC: 31.7 g/dL (ref 31.5–35.7)
MCV: 90 fL (ref 79–97)
Monocytes Absolute: 0.3 10*3/uL (ref 0.1–0.9)
Monocytes: 9 %
Neutrophils Absolute: 2.1 10*3/uL (ref 1.4–7.0)
Neutrophils: 58 %
Platelets: 232 10*3/uL (ref 150–450)
RBC: 3.87 x10E6/uL (ref 3.77–5.28)
RDW: 13 % (ref 11.7–15.4)
WBC: 3.7 10*3/uL (ref 3.4–10.8)

## 2018-07-18 LAB — COMPREHENSIVE METABOLIC PANEL
ALT: 9 IU/L (ref 0–32)
AST: 15 IU/L (ref 0–40)
Albumin/Globulin Ratio: 2 (ref 1.2–2.2)
Albumin: 4.5 g/dL (ref 3.8–4.8)
Alkaline Phosphatase: 56 IU/L (ref 39–117)
BUN/Creatinine Ratio: 15 (ref 9–23)
BUN: 11 mg/dL (ref 6–24)
Bilirubin Total: 0.2 mg/dL (ref 0.0–1.2)
CO2: 23 mmol/L (ref 20–29)
Calcium: 9.1 mg/dL (ref 8.7–10.2)
Chloride: 105 mmol/L (ref 96–106)
Creatinine, Ser: 0.72 mg/dL (ref 0.57–1.00)
GFR calc Af Amer: 119 mL/min/{1.73_m2} (ref >59)
GFR calc non Af Amer: 103 mL/min/{1.73_m2} (ref >59)
Globulin, Total: 2.3 g/dL (ref 1.5–4.5)
Glucose: 67 mg/dL (ref 65–99)
Potassium: 3.9 mmol/L (ref 3.5–5.2)
Sodium: 140 mmol/L (ref 134–144)
Total Protein: 6.8 g/dL (ref 6.0–8.5)

## 2018-07-18 LAB — VITAMIN D 25 HYDROXY (VIT D DEFICIENCY, FRACTURES): Vit D, 25-Hydroxy: 17.7 ng/mL — ABNORMAL LOW (ref 30.0–100.0)

## 2018-07-18 LAB — VITAMIN B12: Vitamin B-12: 570 pg/mL (ref 232–1245)

## 2018-07-18 LAB — LIPID PANEL
Chol/HDL Ratio: 3.1 ratio (ref 0.0–4.4)
Cholesterol, Total: 202 mg/dL — ABNORMAL HIGH (ref 100–199)
HDL: 65 mg/dL (ref >39)
LDL Calculated: 113 mg/dL — ABNORMAL HIGH (ref 0–99)
Triglycerides: 118 mg/dL (ref 0–149)
VLDL Cholesterol Cal: 24 mg/dL (ref 5–40)

## 2018-07-18 LAB — TSH: TSH: 1.43 u[IU]/mL (ref 0.450–4.500)

## 2018-07-18 MED ORDER — VITAMIN D (ERGOCALCIFEROL) 1.25 MG (50000 UNIT) PO CAPS: 50000.0000 [IU] | ORAL_CAPSULE | ORAL | 3 refills | Status: DC

## 2018-07-24 ENCOUNTER — Other Ambulatory Visit: Payer: Self-pay | Admitting: Family Medicine

## 2018-07-24 ENCOUNTER — Ambulatory Visit (INDEPENDENT_AMBULATORY_CARE_PROVIDER_SITE_OTHER): Payer: Self-pay | Admitting: Family Medicine

## 2018-07-24 ENCOUNTER — Other Ambulatory Visit: Payer: Self-pay

## 2018-07-24 ENCOUNTER — Encounter: Payer: Self-pay | Admitting: Family Medicine

## 2018-07-24 VITALS — BP 108/62 | HR 72 | Temp 98.4°F | Ht 60.0 in | Wt 119.0 lb

## 2018-07-24 DIAGNOSIS — F419 Anxiety disorder, unspecified: Secondary | ICD-10-CM

## 2018-07-24 DIAGNOSIS — N898 Other specified noninflammatory disorders of vagina: Secondary | ICD-10-CM | POA: Insufficient documentation

## 2018-07-24 DIAGNOSIS — Z01419 Encounter for gynecological examination (general) (routine) without abnormal findings: Secondary | ICD-10-CM

## 2018-07-24 DIAGNOSIS — R319 Hematuria, unspecified: Secondary | ICD-10-CM

## 2018-07-24 DIAGNOSIS — Z09 Encounter for follow-up examination after completed treatment for conditions other than malignant neoplasm: Secondary | ICD-10-CM

## 2018-07-24 DIAGNOSIS — N390 Urinary tract infection, site not specified: Secondary | ICD-10-CM | POA: Insufficient documentation

## 2018-07-24 DIAGNOSIS — R8290 Unspecified abnormal findings in urine: Secondary | ICD-10-CM

## 2018-07-24 DIAGNOSIS — Z124 Encounter for screening for malignant neoplasm of cervix: Secondary | ICD-10-CM

## 2018-07-24 LAB — POCT URINALYSIS DIP (MANUAL ENTRY)
Bilirubin, UA: NEGATIVE
Blood, UA: NEGATIVE
Glucose, UA: NEGATIVE mg/dL
Ketones, POC UA: NEGATIVE mg/dL
Nitrite, UA: NEGATIVE
Protein Ur, POC: NEGATIVE mg/dL
Spec Grav, UA: 1.015 (ref 1.010–1.025)
Urobilinogen, UA: 2 E.U./dL — AB
pH, UA: 5.5 (ref 5.0–8.0)

## 2018-07-24 MED ORDER — SULFAMETHOXAZOLE-TRIMETHOPRIM 800-160 MG PO TABS: 1.0000 | ORAL_TABLET | Freq: Two times a day (BID) | ORAL | 0 refills | Status: DC

## 2018-07-24 NOTE — Progress Notes (Signed)
[facility]   Pap Smear  Subjective:  Patient ID: Pamela Wilkinson, female    DOB: 06/04/1975  Age: 43 y.o. MRN: [id number]  CC:  Chief Complaint  Patient presents with  . Gynecologic Exam    HPI Pamela Wilkinson is a 43 year old female who presents for Pap Smear today.   Past Medical History:  Diagnosis Date  . Situational anxiety   . Vitamin D deficiency 07/2018    Current Status: Since her last office visit, she is doing well with no complaints. She does perform monthly breast exams at home. She has not noticed any pain, tenderness, or discharge. She is not aware of any family history of cancer.  She typically follows a balanced diet but does not exercise routinely. Her last menstrual period was 07/03/2018, and normal duration of 5 days, but heavier blood flow. She denies vaginal or urinary frequency, discharge, dysuria, urinary itching, burning, odor, hematuria, and suprapubic pain/discomfort. No reports of GI problems such as nausea, vomiting, diarrhea, and constipation. She has no reports of blood in stools. Her anxiety is mild today. She denies suicidal ideations, homicidal ideations, or auditory hallucinations.  She denies fevers, chills, fatigue, recent infections, weight loss, and night sweats. She has not had any headaches, visual changes, dizziness, and falls. No chest pain, heart palpitations, cough and shortness of breath reported.  She denies pain today.   History reviewed. No pertinent surgical history.  Family History  Problem Relation Age of Onset  . Hypertension Mother   . Heart disease Maternal Aunt     Social History   Socioeconomic History  . Marital status: Married    Spouse name: Not on file  . Number of children: Not on file  . Years of education: Not on file  . Highest education level: Not on file  Occupational History  . Not on file  Social Needs  . Financial resource  strain: Not on file  . Food insecurity    Worry: Not on file    Inability: Not on file  . Transportation needs    Medical: Not on file    Non-medical: Not on file  Tobacco Use  . Smoking status: Never Smoker  . Smokeless tobacco: Never Used  Substance and Sexual Activity  . Alcohol use: Not Currently  . Drug use: No  . Sexual activity: Yes    Birth control/protection: None  Lifestyle  . Physical activity    Days per week: Not on file    Minutes per session: Not on file  . Stress: Not on file  Relationships  . Social Musicianconnections    Talks on phone: Not on file    Gets together: Not on file    Attends religious service: Not on file    Active member of club or organization: Not on file    Attends meetings of clubs or organizations: Not on file    Relationship status: Not on file  . Intimate partner violence    Fear of current or ex partner: Not on file    Emotionally abused: Not on file    Physically abused: Not on file    Forced sexual activity: Not on file  Other Topics Concern  . Not on file  Social History Narrative  . Not on file    Outpatient Medications Prior to Visit  Medication Sig Dispense Refill  . acetaminophen (TYLENOL) 500 MG tablet Take 500 mg by mouth  every 6 (six) hours as needed for headache.    . busPIRone (BUSPAR) 7.5 MG tablet Take 1 tablet (7.5 mg total) by mouth 2 (two) times daily. 60 tablet 3  . butalbital-acetaminophen-caffeine (FIORICET) 50-325-40 MG tablet Take 1-2 tablets by mouth every 6 (six) hours as needed for headache. 20 tablet 0  . cetirizine (ZYRTEC) 10 MG tablet Take 1 tablet (10 mg total) by mouth daily. 30 tablet 11  . ferrous sulfate (FERROUSUL) 325 (65 FE) MG tablet Take 1 tablet (325 mg total) by mouth 3 (three) times daily with meals. 90 tablet 3  . naproxen (NAPROSYN) 500 MG tablet Take 1 tablet (500 mg total) by mouth 2 (two) times daily with a meal. 60 tablet 3  . topiramate (TOPAMAX) 25 MG tablet Take 1 tablet (25 mg total) by  mouth 2 (two) times daily. 60 tablet 3  . Vitamin D, Ergocalciferol, (DRISDOL) 1.25 MG (50000 UT) CAPS capsule Take 1 capsule (50,000 Units total) by mouth every 7 (seven) days. 5 capsule 3   No facility-administered medications prior to visit.     No Known Allergies  ROS Review of Systems  Constitutional: Negative.   HENT: Negative.   Eyes: Negative.   Respiratory: Negative.   Cardiovascular: Negative.   Gastrointestinal: Negative.   Genitourinary: Positive for vaginal discharge.  Musculoskeletal: Negative.   Skin: Negative.   Allergic/Immunologic: Negative.   Neurological: Negative.   Hematological: Negative.   Psychiatric/Behavioral: Negative.       Objective:    Physical Exam  Constitutional: She is oriented to person, place, and time. She appears well-developed and well-nourished.  HENT:  Head: Normocephalic and atraumatic.  Eyes: Conjunctivae are normal.  Neck: Normal range of motion. Neck supple.  Cardiovascular: Normal rate, regular rhythm, normal heart sounds and intact distal pulses.  Pulmonary/Chest: Effort normal and breath sounds normal.  Abdominal: Soft. Bowel sounds are normal.  Musculoskeletal: Normal range of motion.  Neurological: She is alert and oriented to person, place, and time. She has normal reflexes.  Skin: Skin is warm and dry.  Psychiatric: She has a normal mood and affect. Her behavior is normal. Judgment and thought content normal.  Nursing note and vitals reviewed.   BP 108/62 (BP Location: Left Arm, Patient Position: Sitting, Cuff Size: Small)   Pulse 72   Temp 98.4 F (36.9 C) (Oral)   Ht 5' (1.524 m)   Wt 119 lb (54 kg)   LMP 07/09/2018   SpO2 100%   BMI 23.24 kg/m  Wt Readings from Last 3 Encounters:  07/24/18 119 lb (54 kg)  07/17/18 119 lb 3.2 oz (54.1 kg)  12/19/17 110 lb 12.8 oz (50.3 kg)     Health Maintenance Due  Topic Date Due  . HIV Screening  04/20/1990    There are no preventive care reminders to display  for this patient.  Lab Results  Component Value Date   TSH 1.430 07/17/2018   Lab Results  Component Value Date   WBC 3.7 07/17/2018   HGB 11.1 07/17/2018   HCT 35.0 07/17/2018   MCV 90 07/17/2018   PLT 232 07/17/2018   Lab Results  Component Value Date   NA 140 07/17/2018   K 3.9 07/17/2018   CO2 23 07/17/2018   GLUCOSE 67 07/17/2018   BUN 11 07/17/2018   CREATININE 0.72 07/17/2018   BILITOT 0.2 07/17/2018   ALKPHOS 56 07/17/2018   AST 15 07/17/2018   ALT 9 07/17/2018   PROT 6.8 07/17/2018  ALBUMIN 4.5 07/17/2018   CALCIUM 9.1 07/17/2018   ANIONGAP 9 01/19/2016   Lab Results  Component Value Date   CHOL 202 (H) 07/17/2018   Lab Results  Component Value Date   HDL 65 07/17/2018   Lab Results  Component Value Date   LDLCALC 113 (H) 07/17/2018   Lab Results  Component Value Date   TRIG 118 07/17/2018   Lab Results  Component Value Date   CHOLHDL 3.1 07/17/2018   Lab Results  Component Value Date   HGBA1C 4.8 11/01/2017   Assessment & Plan:   1. Encounter for gynecological examination She is up to date on her mammogram  Recommend monthly self breast exam Recommend daily multivitamin for women Recommend strength training in 150 minutes of cardiovascular exercise per week.  2. Pap smear for cervical cancer screening Patient tolerated exam well with no complaints.  - POCT urinalysis dipstick - Pap IG w/ reflex to HPV when ASC-U  3. Vaginal discharge  4. Abnormal urinalysis Results are pending.  - Urine Culture  5. Urinary tract infection with hematuria, site unspecified We will initiate Bactrim today  - sulfamethoxazole-trimethoprim (BACTRIM DS) 800-160 MG tablet; Take 1 tablet by mouth 2 (two) times daily.  Dispense: 14 tablet; Refill: 0  6. Anxiety Stable.   7. Follow up She will follow up in 6 months.    Meds ordered this encounter  Medications  . sulfamethoxazole-trimethoprim (BACTRIM DS) 800-160 MG tablet    Sig: Take 1 tablet by  mouth 2 (two) times daily.    Dispense:  14 tablet    Refill:  0    Orders Placed This Encounter  Procedures  . Urine Culture  . POCT urinalysis dipstick    Referral Orders  No referral(s) requested today    Raliegh IpNatalie Orland Visconti,  MSN, FNP-BC [facility] [address]  [place], [place] [address] [phone]      Problem List Items Addressed This Visit      Other   Anxiety   Pap smear for cervical cancer screening    Other Visit Diagnoses    Encounter for gynecological examination    -  Primary   Relevant Orders   POCT urinalysis dipstick (Completed)   Pap IG w/ reflex to HPV when ASC-U   Vaginal discharge       Abnormal urinalysis       Relevant Orders   Urine Culture   Urinary tract infection with hematuria, site unspecified       Relevant Medications   sulfamethoxazole-trimethoprim (BACTRIM DS) 800-160 MG tablet   Follow up          Meds ordered this encounter  Medications  . sulfamethoxazole-trimethoprim (BACTRIM DS) 800-160 MG tablet    Sig: Take 1 tablet by mouth 2 (two) times daily.    Dispense:  14 tablet    Refill:  0    Follow-up: Return in about 6 months (around 01/24/2019).    Kallie LocksNatalie M Axil Copeman, FNP

## 2018-07-26 LAB — URINE CULTURE

## 2018-07-27 ENCOUNTER — Other Ambulatory Visit: Payer: Self-pay | Admitting: Family Medicine

## 2018-07-27 DIAGNOSIS — N390 Urinary tract infection, site not specified: Secondary | ICD-10-CM

## 2018-07-27 DIAGNOSIS — B9620 Unspecified Escherichia coli [E. coli] as the cause of diseases classified elsewhere: Secondary | ICD-10-CM

## 2018-07-27 MED ORDER — NITROFURANTOIN MONOHYD MACRO 100 MG PO CAPS: 100.0000 mg | ORAL_CAPSULE | Freq: Two times a day (BID) | ORAL | 0 refills | Status: AC

## 2018-07-28 LAB — PAP IG W/ RFLX HPV ASCU

## 2019-01-24 ENCOUNTER — Ambulatory Visit: Payer: Self-pay | Admitting: Family Medicine

## 2020-01-21 IMAGING — MG DIGITAL SCREENING BILATERAL MAMMOGRAM WITH TOMO AND CAD
8 series · 9 of 24 positions shown · non-contrast
Comparison: Previous exam(s).

CLINICAL DATA: Screening.

EXAM:
DIGITAL SCREENING BILATERAL MAMMOGRAM WITH TOMO AND CAD

[L MLO synth-2D]
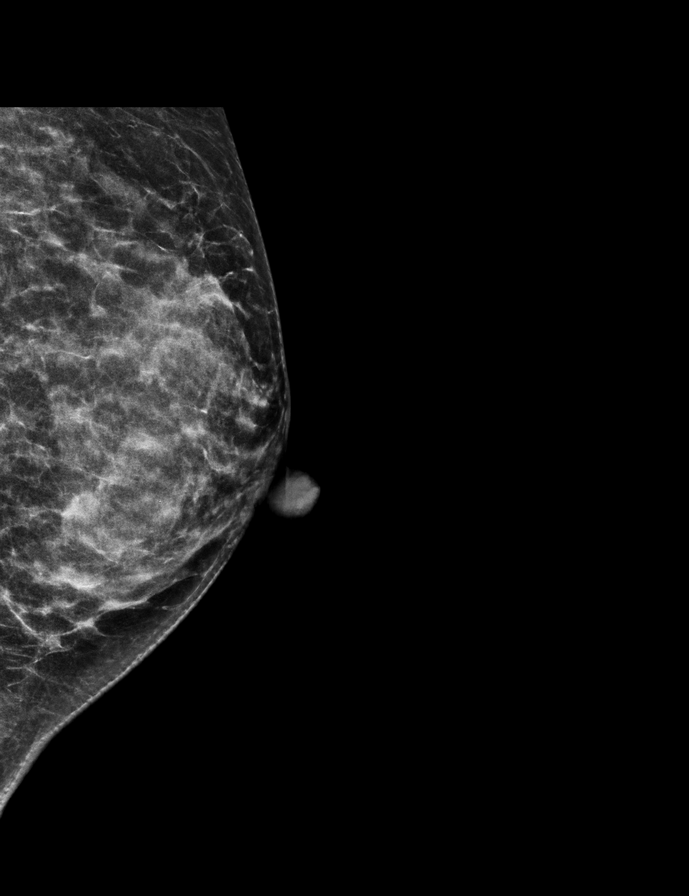

[R MLO synth-2D]
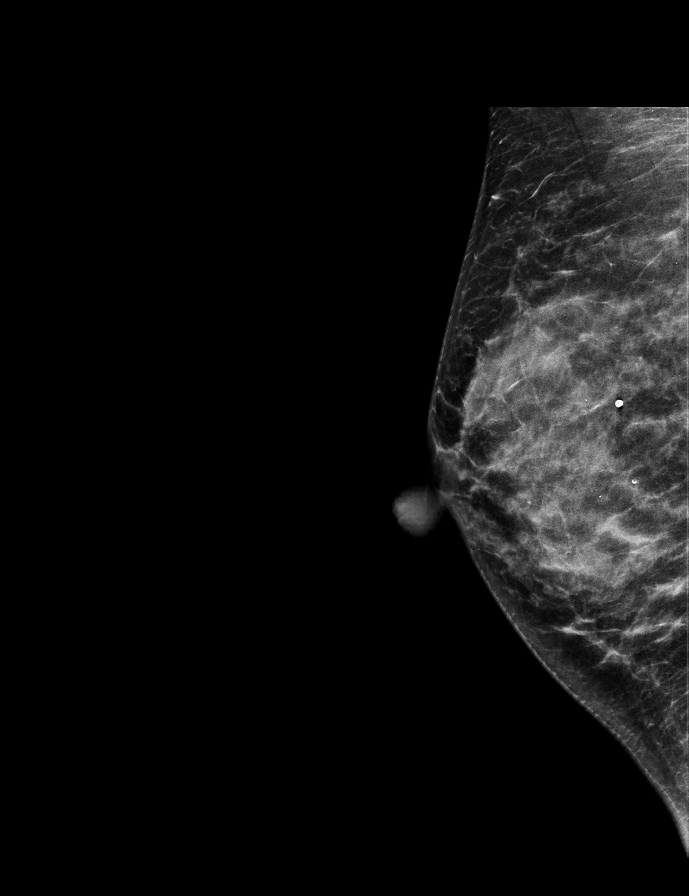

[L CC synth-2D]
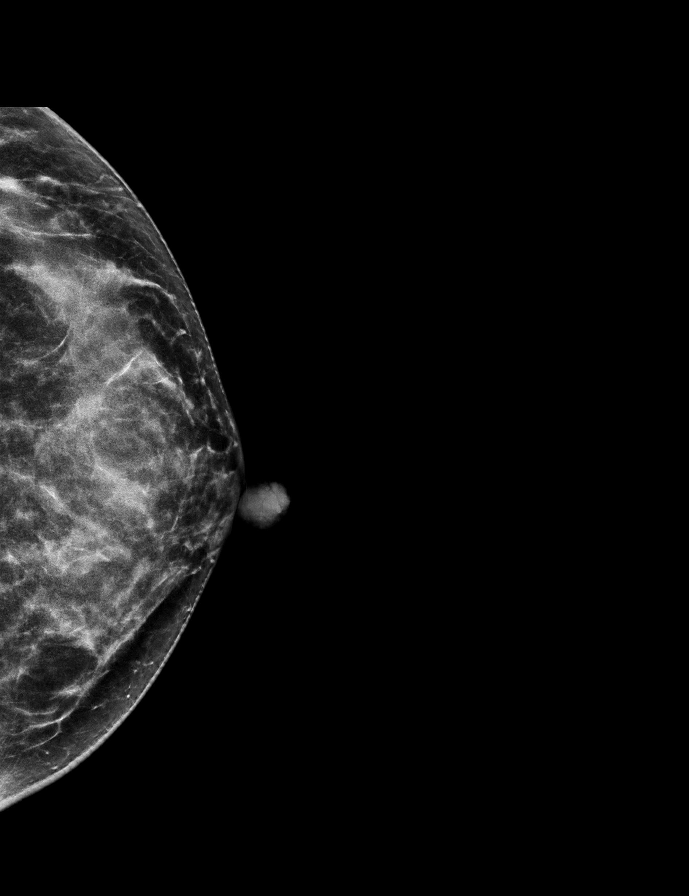

[R CC synth-2D]
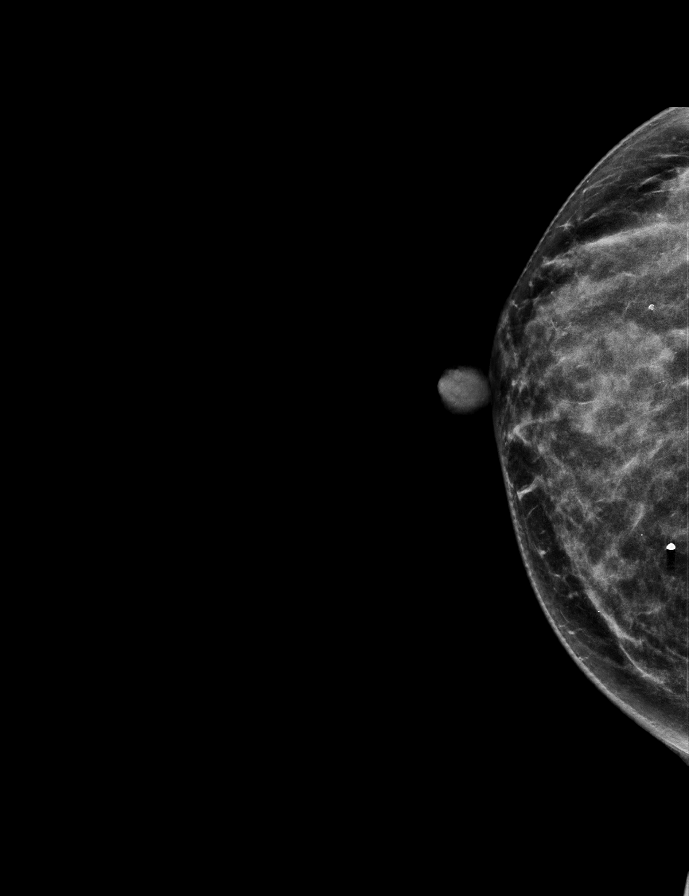

[L CC tomo · 2 of 59 frames shown]
[frame 20/59]
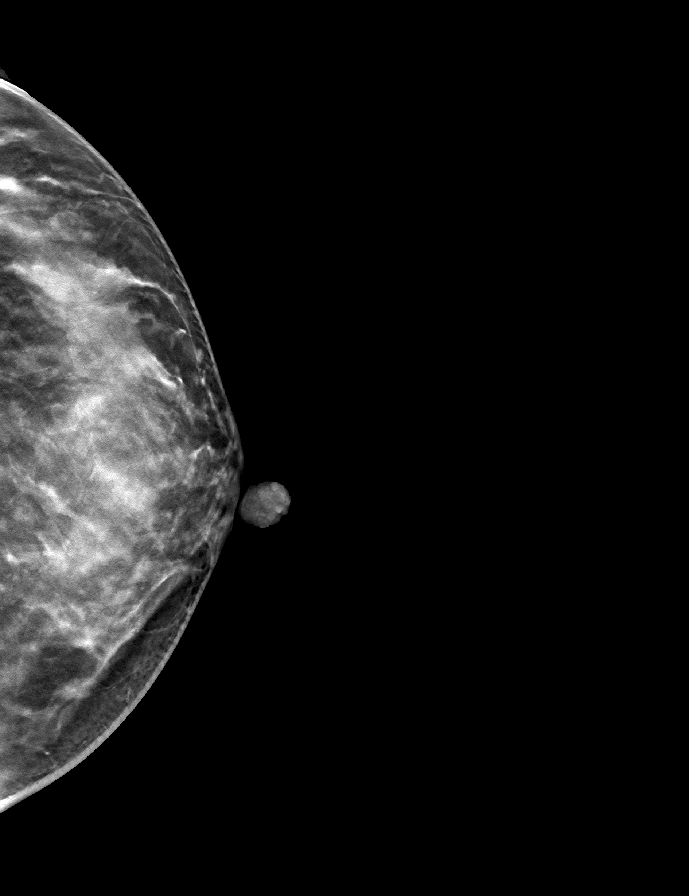
[frame 30/59]
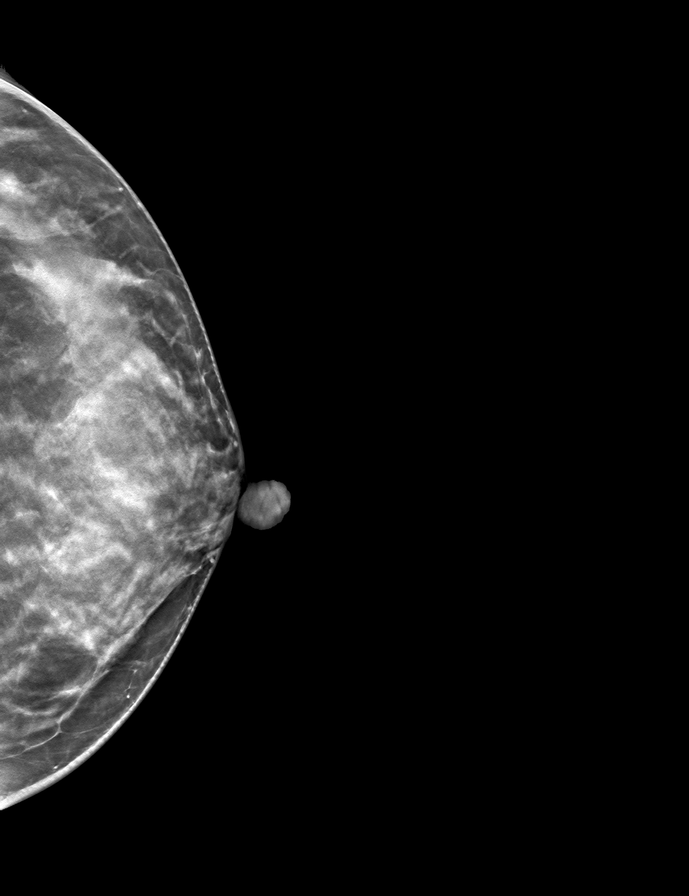

[L MLO tomo · tomo slice 28/55.0]
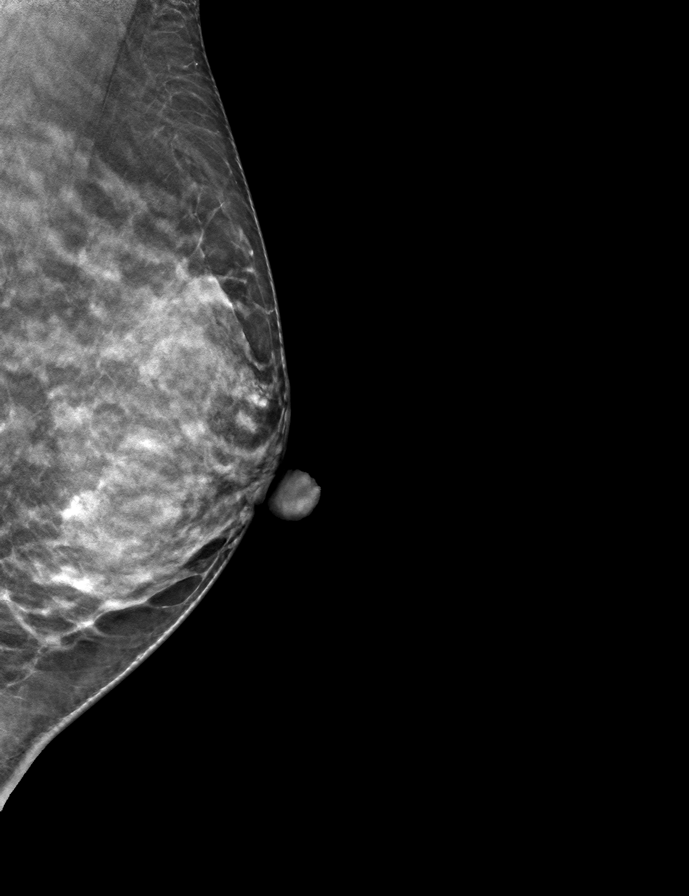

[R MLO tomo · tomo slice 29/57.0]
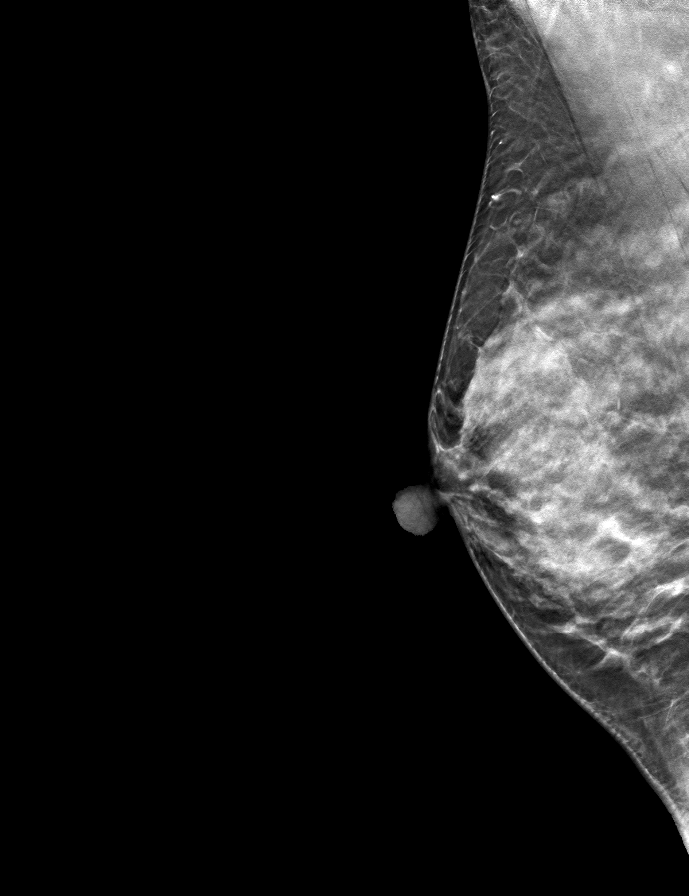

[R CC tomo · tomo slice 26/51.0]
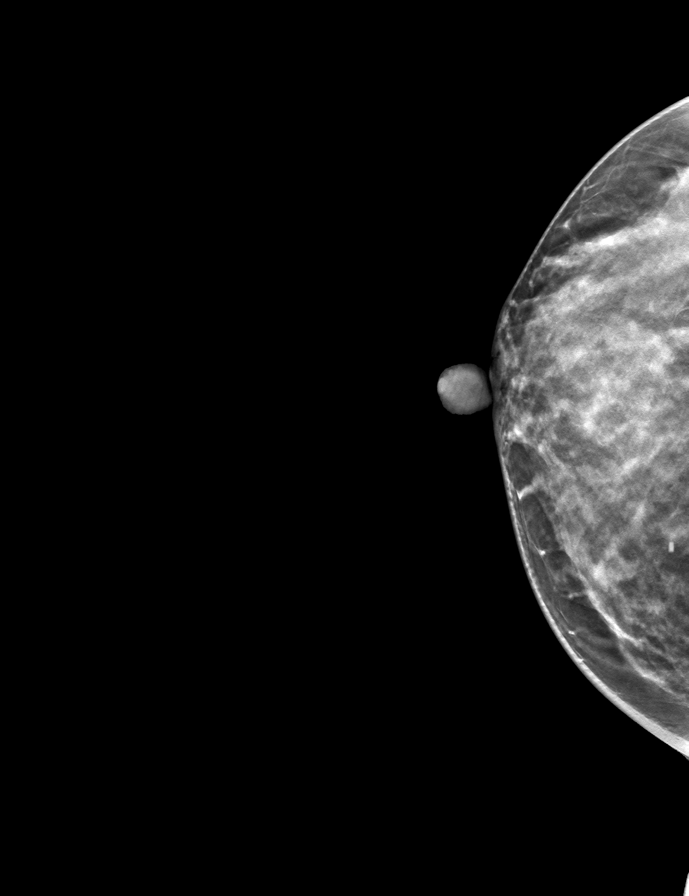

[9 of 24 positions shown; findings below may reference images not displayed]

ACR Breast Density Category c: The breast tissue is heterogeneously
dense, which may obscure small masses.
FINDINGS: There are no findings suspicious for malignancy. Images were
processed with CAD.
IMPRESSION: No mammographic evidence of malignancy. A result letter of this
screening mammogram will be mailed directly to the patient.

RECOMMENDATION:
Screening mammogram in one year. (Code:FT-U-LHB)

BI-RADS CATEGORY  1: Negative.

## 2020-01-25 ENCOUNTER — Telehealth (INDEPENDENT_AMBULATORY_CARE_PROVIDER_SITE_OTHER): Payer: Self-pay | Admitting: Family Medicine

## 2020-01-25 DIAGNOSIS — R519 Headache, unspecified: Secondary | ICD-10-CM

## 2020-01-25 DIAGNOSIS — N898 Other specified noninflammatory disorders of vagina: Secondary | ICD-10-CM

## 2020-01-25 DIAGNOSIS — F419 Anxiety disorder, unspecified: Secondary | ICD-10-CM

## 2020-01-25 DIAGNOSIS — G8929 Other chronic pain: Secondary | ICD-10-CM

## 2020-01-25 DIAGNOSIS — Z789 Other specified health status: Secondary | ICD-10-CM

## 2020-01-25 DIAGNOSIS — Z603 Acculturation difficulty: Secondary | ICD-10-CM

## 2020-01-25 DIAGNOSIS — Z758 Other problems related to medical facilities and other health care: Secondary | ICD-10-CM

## 2020-01-25 DIAGNOSIS — Z7689 Persons encountering health services in other specified circumstances: Secondary | ICD-10-CM

## 2020-01-25 DIAGNOSIS — N926 Irregular menstruation, unspecified: Secondary | ICD-10-CM

## 2020-01-25 DIAGNOSIS — Z09 Encounter for follow-up examination after completed treatment for conditions other than malignant neoplasm: Secondary | ICD-10-CM

## 2020-01-25 NOTE — Progress Notes (Signed)
Virtual Visit via Telephone Note  I connected with Pamela Wilkinson on 01/26/20 at  8:00 AM EST by telephone and verified that I am speaking with the correct person using two identifiers.  Location: Patient: Home Provider: Office   I discussed the limitations, risks, security and privacy concerns of performing an evaluation and management service by telephone and the availability of in person appointments. I also discussed with the patient that there may be a patient responsible charge related to this service. The patient expressed understanding and agreed to proceed.   History of Present Illness:  Patient Active Problem List   Diagnosis Date Noted  . Urinary tract infection with hematuria 07/24/2018  . Vaginal discharge 07/24/2018  . Chronic intractable headache 07/17/2018  . Seasonal allergies 07/17/2018  . Bleeding nose 07/17/2018  . Bruising 07/17/2018  . Anxiety 07/17/2018  . Anemia 02/06/2013  . Pap smear for cervical cancer screening 11/06/2012  . Nipple pain 11/06/2012   Current Status: Since her last office visit, she is here today to re-establish care with our clinic. She has c/o missed periods, which she believes is premenopausal. She has been taking OTC medication for yeast infection. We will use Hispanic Interpreter today. She denies fevers, chills, fatigue, recent infections, weight loss, and night sweats. She has not had any headaches, visual changes, dizziness, and falls. No chest pain, heart palpitations, cough and shortness of breath reported. Denies GI problems such as nausea, vomiting, diarrhea, and constipation. She has no reports of blood in stools, dysuria and hematuria. No depression or anxiety reported today. She is taking all medications as prescribed. She denies pain today.   Observations/Objective:  Telephone Virtual Visit   Assessment and Plan:  1. Encounter to establish care  2. Missed periods We will possibly refer her to GYN in  future.   3. Vaginal discharge Continue to use OTC vaginal cream as needed.   4. Chronic intractable headache, unspecified headache type Stable. Continue OTC pain medications as needed.   5. Anxiety Stable today.   6. Language barrier Video Hispanic Interpreter used today.   7. Follow up She will follow in 1 month for Annual Physical and Labwork.   No orders of the defined types were placed in this encounter.   No orders of the defined types were placed in this encounter.   Referral Orders  No referral(s) requested today    Raliegh Ip, MSN, ANE, FNP-BC [facility] [address] [place], [place] [address] [phone] fax  I discussed the assessment and treatment plan with the patient. The patient was provided an opportunity to ask questions and all were answered. The patient agreed with the plan and demonstrated an understanding of the instructions.   The patient was advised to call back or seek an in-person evaluation if the symptoms worsen or if the condition fails to improve as anticipated.  I provided 20 minutes of non-face-to-face time during this encounter.   Kallie Locks, FNP

## 2020-01-26 ENCOUNTER — Encounter: Payer: Self-pay | Admitting: Family Medicine

## 2020-02-12 DIAGNOSIS — E785 Hyperlipidemia, unspecified: Secondary | ICD-10-CM

## 2020-02-12 HISTORY — DX: Hyperlipidemia, unspecified: E78.5

## 2020-02-26 ENCOUNTER — Other Ambulatory Visit: Payer: Self-pay

## 2020-02-26 ENCOUNTER — Ambulatory Visit (INDEPENDENT_AMBULATORY_CARE_PROVIDER_SITE_OTHER): Payer: Self-pay | Admitting: Family Medicine

## 2020-02-26 ENCOUNTER — Encounter: Payer: Self-pay | Admitting: Family Medicine

## 2020-02-26 VITALS — BP 118/62 | HR 74 | Ht 60.0 in | Wt 122.0 lb

## 2020-02-26 DIAGNOSIS — N926 Irregular menstruation, unspecified: Secondary | ICD-10-CM

## 2020-02-26 DIAGNOSIS — T148XXA Other injury of unspecified body region, initial encounter: Secondary | ICD-10-CM

## 2020-02-26 DIAGNOSIS — N898 Other specified noninflammatory disorders of vagina: Secondary | ICD-10-CM

## 2020-02-26 DIAGNOSIS — Z09 Encounter for follow-up examination after completed treatment for conditions other than malignant neoplasm: Secondary | ICD-10-CM

## 2020-02-26 DIAGNOSIS — F419 Anxiety disorder, unspecified: Secondary | ICD-10-CM

## 2020-02-26 DIAGNOSIS — R040 Epistaxis: Secondary | ICD-10-CM

## 2020-02-26 DIAGNOSIS — R519 Headache, unspecified: Secondary | ICD-10-CM

## 2020-02-26 DIAGNOSIS — G8929 Other chronic pain: Secondary | ICD-10-CM

## 2020-02-26 DIAGNOSIS — Z758 Other problems related to medical facilities and other health care: Secondary | ICD-10-CM

## 2020-02-26 DIAGNOSIS — Z789 Other specified health status: Secondary | ICD-10-CM

## 2020-02-26 DIAGNOSIS — Z0000 Encounter for general adult medical examination without abnormal findings: Secondary | ICD-10-CM

## 2020-02-26 DIAGNOSIS — R002 Palpitations: Secondary | ICD-10-CM

## 2020-02-26 DIAGNOSIS — Z202 Contact with and (suspected) exposure to infections with a predominantly sexual mode of transmission: Secondary | ICD-10-CM

## 2020-02-26 LAB — POCT URINALYSIS DIPSTICK
Bilirubin, UA: NEGATIVE
Blood, UA: NEGATIVE
Glucose, UA: NEGATIVE
Ketones, UA: NEGATIVE
Leukocytes, UA: NEGATIVE
Nitrite, UA: NEGATIVE
Protein, UA: NEGATIVE
Spec Grav, UA: 1.015 (ref 1.010–1.025)
Urobilinogen, UA: 0.2 E.U./dL
pH, UA: 6 (ref 5.0–8.0)

## 2020-02-26 MED ORDER — CLOTRIMAZOLE 3 2 % VA CREA: 1.0000 | TOPICAL_CREAM | Freq: Every day | VAGINAL | 1 refills | Status: DC

## 2020-02-26 MED ORDER — FLUCONAZOLE 150 MG PO TABS: 150.0000 mg | ORAL_TABLET | Freq: Once | ORAL | 0 refills | Status: AC

## 2020-02-26 NOTE — Patient Instructions (Signed)
Infeccin mictica vaginal en los adultos Vaginal Yeast Infection, Adult  La infeccin mictica vaginal es una afeccin que causa secrecin vaginal y Garment/textile technologist, hinchazn y enrojecimiento (inflamacin) de la vagina. Esta es una afeccin frecuente. Algunas mujeres contraen esta infeccin con frecuencia. Cules son las causas? La causa de la infeccin es un cambio en el equilibrio normal de los hongos (cndida) y las bacterias que viven en la vagina. Esta alteracin deriva en el crecimiento excesivo de los hongos, lo que causa la inflamacin. Qu incrementa el riesgo? La afeccin es ms probable en las mujeres que tienen estas caractersticas:  Toman antibiticos.  Tienen diabetes.  Toman anticonceptivos orales.  Estn embarazadas.  Se hacen duchas vaginales con frecuencia.  Tienen debilitado el sistema de defensa del organismo (sistema inmunitario).  Han estado tomando medicamentos con corticoesteroides durante [employer].  Usan ropa ajustada con frecuencia. Cules son los signos o sntomas? Los sntomas de esta afeccin incluyen:  Secrecin vaginal blanca, cremosa y espesa.  Hinchazn, picazn, enrojecimiento e irritacin de la vagina. Los labios de la vagina (vulva) tambin se pueden infectar.  Dolor o ardor al Garment/textile technologist.  [place]. Cmo se diagnostica? Esta afeccin se diagnostica en funcin de lo siguiente:  Sus antecedentes mdicos.  Un examen fsico.  Un examen plvico. El mdico examinar una muestra de la secrecin vaginal con un microscopio. Probablemente el mdico enve esta muestra al laboratorio para analizarla y confirmar el diagnstico. Cmo se trata? Esta afeccin se trata con medicamentos. Los [place] pueden ser recetados o de venta libre. Podrn indicarle que use uno o ms de lo siguiente:  Medicamentos que se toman por boca (orales).  Medicamentos que se aplican como una crema (tpicos).  Medicamentos que se  colocan directamente en la vagina (vulos vaginales). Siga estas instrucciones en su casa: Estilo de vida  No tenga relaciones sexuales hasta que el mdico lo autorice. Comunique a su compaero sexual que tiene una infeccin por hongos. Esa persona debera visitar a su mdico y preguntarle si debera recibir tratamiento tambin.  No use ropa ajustada, como pantimedias o pantalones ajustados.  Use ropa interior de algodn, que permite el paso del aire. Instrucciones generales  Tome o aplquese los medicamentos de venta libre y los recetados solamente como se lo haya indicado el mdico.  Consuma ms yogur. Esto puede ayudar a Technical brewer de la infeccin mictica.  No use tampones hasta que el mdico la autorice.  Intente darse un bao de asiento para [employer]. Se trata de un bao de agua tibia que se toma mientras se est sentada. El agua solo debe Systems analyst las caderas y cubrir las nalgas. Hgalo 3o 4veces al da o como se lo haya indicado el mdico.  No se haga duchas vaginales.  Si tiene diabetes, mantenga bajo control los niveles de Dispensing optician.  Concurra a todas las visitas de seguimiento como se lo haya indicado el mdico. Esto es importante.   Comunquese con un mdico si:  Tiene fiebre.  Los sntomas desaparecen y luego vuelven a Arts administrator.  Los sntomas no mejoran con Dispensing optician.  Sus sntomas empeoran.  Aparecen nuevos sntomas.  Aparecen ampollas alrededor o adentro de la vagina.  Le sale sangre de la vagina y no est menstruando.  Siente dolor en el abdomen. Resumen  La infeccin mictica vaginal es una afeccin que causa secrecin y Garment/textile technologist, hinchazn y enrojecimiento (inflamacin) de la vagina.  Esta afeccin se trata con medicamentos.  Los [employer] pueden ser recetados o de [address].  Tome o aplquese los medicamentos de venta libre y los recetados solamente como se lo haya indicado el mdico.  No se haga  duchas vaginales. No tenga relaciones sexuales ni use tampones hasta que el mdico la autorice.  Comunquese con un mdico si los sntomas no mejoran con el tratamiento o si los sntomas desaparecen y [employer]. Esta informacin no tiene Theme park manager el consejo del mdico. Asegrese de hacerle al mdico cualquier pregunta que tenga. Document Revised: 09/12/2018 Document Reviewed: 09/12/2018 Elsevier Patient Education  2021 Elsevier Inc. Fluconazole tablets Qu es [place] medicamento? El FLUCONAZOL es un antimictico. Se utiliza en el tratamiento de ciertos tipos de infecciones micticas o por cndida. Este medicamento puede ser utilizado para otros usos; si tiene alguna pregunta consulte con su proveedor de atencin mdica o con su farmacutico. MARCAS COMUNES: Diflucan Qu le debo informar a mi profesional de la salud antes de tomar este medicamento? Necesitan saber si usted presenta alguno de los siguientes problemas o situaciones: ritmo o frecuencia cardiaca irregular enfermedad renal enfermedad heptica niveles bajos de potasio en la sangre una reaccin alrgica o inusual al fluconazol, a otros antimicticos del grupo de los azoles, medicamentos, alimentos, colorantes o conservantes si est embarazada o buscando quedar embarazada si est amamantando a un beb Cmo debo [employer]? Tome este medicamento por va oral. Siga las instrucciones de la etiqueta del [place]. No use su medicamento con una frecuencia mayor a la indicada. Hable con su pediatra para informarse acerca del uso de este medicamento en nios. Puede requerir atencin especial. Este medicamento se ha usado en nios tan pequeos como de 6 meses de edad. Sobredosis: Pngase en contacto inmediatamente con un centro toxicolgico o una sala de urgencia si usted cree que haya tomado demasiado medicamento. ATENCIN: [employer] es solo para usted. No comparta este medicamento con nadie. Qu sucede  si me olvido de una dosis? Si olvida una dosis, adminstrela lo antes posible. Si es casi la hora de la prxima dosis, administre solo esa dosis. No se administre dosis adicionales o dobles. Qu puede interactuar con este medicamento? No use este medicamento con ninguno de los siguientes frmacos: flibanserina lomitapida lonafarnib otros medicamentos que prolongan el intervalo QT (causan un ritmo cardiaco anormal) triazolam Este medicamento tambin puede interactuar con los siguientes frmacos: ciertos antibiticos, tales como rifabutina, rifampicina ciertos medicamentos antivirales para VIH o hepatitis ciertos medicamentos para la presin sangunea, enfermedad cardiaca y frecuencia cardiaca irregular ciertos medicamentos para el colesterol, tales como atorvastatina, lovastatina y simvastatina ciertos medicamentos para la depresin, tales como amitriptilina y nortriptilina ciertos medicamentos para la diabetes, tales como glipizida o gliburida ciertos medicamentos para convulsiones, tales como carbamazepina, fenitona ciertos medicamentos que tratan o previenen cogulos sanguneos, como warfarina ciertos medicamentos narcticos para Chief Technology Officer, tales como alfentanilo, fentanilo, metadona ciclofosfamida ciclosporina ibrutinib lemborexant midazolam AINE, medicamentos para Chief Technology Officer y Futures trader, tales como ibuprofeno o naproxeno olaparib sirolims medicamentos esteroideos como la prednisona tacrolims teofilina tofacitinib tolvaptn vinblastina vincristina vitamina A voriconazol Puede ser que esta lista no menciona todas las posibles interacciones. Informe a su profesional de [employer] de [employer] productos a base de hierbas, medicamentos de [place] o suplementos nutritivos que est tomando. Si usted fuma, consume bebidas alcohlicas o si utiliza drogas ilegales, indqueselo tambin a su profesional de [employer]. Algunas sustancias pueden interactuar con su medicamento. A qu debo estar atento al usar  [employer]? Visite a su  mdico o a su profesional de la salud para que revise su evolucin peridicamente. Usted podra necesitar realizarse anlisis de sangre si est usando este medicamento por un periodo prolongado. Si los sntomas no mejoran, consulte a su mdico. Algunas infecciones micticas necesitan muchas semanas o meses de tratamiento para curarse. El alcohol puede aumentar el posible dao al hgado. Evite consumir bebidas alcohlicas. Si tiene una infeccin vaginal, no tenga relaciones sexuales Scientist, forensic. Puede usar Photographer. No use tampones. Use ropa interior recin lavada de algodn, no de tela sinttica. Qu efectos secundarios puedo tener al [employer] este medicamento? Efectos secundarios que debe informar a su mdico o a Producer, television/film/video de la salud tan pronto como sea posible: Therapist, art, tales como erupcin cutnea o comezn/picazn, urticarias, o hinchazn de los labios, la boca, la lengua o la garganta orina oscura sensacin de mareo o desmayo frecuencia cardiaca irregular o dolor en el pecho enrojecimiento, formacin de ampollas, descamacin o distensin de la piel, incluso dentro de la boca problemas para respirar sangrado o moretones inusuales vmito color amarillento de los ojos o la piel Efectos secundarios que generalmente no requieren atencin mdica (infrmelos a su mdico o a Producer, television/film/video de la salud si persisten o si son molestos): Scientist, water quality de los alimentos diarrea dolor de cabeza Programme researcher, broadcasting/film/video o nuseas Puede ser que esta lista no menciona todos los posibles efectos secundarios. Comunquese a su mdico por asesoramiento mdico [employer]. Usted puede informar los efectos secundarios a la FDA por telfono al 1-800-FDA-1088. Dnde debo guardar mi medicina? Mantenga fuera del alcance de los nios. Guarde a temperatura ambiente inferior a 30 grados Celsius (86 grados Fahrenheit). Deseche todo el  medicamento despus de la fecha de vencimiento. ATENCIN: Este folleto es un resumen. Puede ser que no cubra toda la posible informacin. Si usted tiene preguntas acerca de esta medicina, consulte con su mdico, su farmacutico o su profesional de Radiographer, therapeutic.  2021 Elsevier/Gold Standard (2019-12-12 00:00:00) Clotrimazole vaginal cream Qu es este medicamento? El CLOTRIMAZOL es un medicamento antimictico. Se utiliza para tratar infecciones vaginales por levadura. Este medicamento puede ser utilizado para otros usos; si tiene alguna pregunta consulte con su proveedor de atencin mdica o con su farmacutico. MARCAS COMUNES: Gyne-Lotrimin, Mycelex Qu le debo informar a mi profesional de la salud antes de tomar este medicamento? Necesita saber si usted presenta alguno de los [employer] o situaciones:  diabetes  tiene infecciones frecuentes  infeccin por VIH o SIDA  una reaccin alrgica o inusual al clotrimazol, a otros medicamentos, alimentos, colorantes o conservantes  si est embarazada o buscando quedar embarazada  si est amamantando a un beb Cmo debo utilizar este medicamento? Este medicamento se utiliza en la vagina. Puede aplicarse a las zonas externas de la piel que rodean la vagina a fin de Technical sales engineer picazn y Environmental health practitioner. No lo ingiera por va oral. Lvese las manos antes y despus de usarlo. Lea las instrucciones del envase antes de usarlo. No utilice su medicamento con una frecuencia mayor a la indicada. No deje de usar excepto si as lo indica su mdico. Hable con su pediatra para informarse acerca del uso de este medicamento en nios. Aunque este medicamento ha sido recetado a nios tan menores como de 12 aos de edad para condiciones selectivas, las precauciones se aplican. Sobredosis: Pngase en contacto inmediatamente con un centro toxicolgico o una sala de urgencia si usted cree que haya tomado demasiado medicamento. ATENCIN: [place]  medicamento es solo para  usted. No comparta este medicamento con nadie. Qu sucede si me olvido de una dosis? Si olvida una dosis, aplquela lo antes posible. Si es casi la hora de la prxima dosis, aplique slo esa dosis. No use dosis adicionales o dobles. Qu puede interactuar con este medicamento?  espermicidas No utilice otros productos vaginales sin Science writer a su mdico o a su profesional de Radiographer, therapeutic. Puede ser que esta lista no menciona todas las posibles interacciones. Informe a su profesional de [employer] de [employer] productos a base de hierbas, medicamentos de [place] o suplementos nutritivos que est tomando. Si usted fuma, consume bebidas alcohlicas o si utiliza drogas ilegales, indqueselo tambin a su profesional de [employer]. Algunas sustancias pueden interactuar con su medicamento. A qu debo estar atento al usar [employer]? Si los sntomas no mejoran despus de unos [address], consulte con su mdico o con su profesional de [employer]. Es preferible que no tenga relaciones sexuales hasta que haya completado el [place]. Este medicamento puede daar los preservativos o diafragmas y reducir su efectividad. Puede disminuir la accin adecuada de los espermicidas. No confe en estos mtodos para evitar el embarazo o enfermedades de transmisin sexual mientras est usando este medicamento. Por lo general, los medicamentos vaginales salen por la vagina durante el tratamiento. Use toallas higinicas para evitar que el medicamento manche su ropa. No se recomienda el uso de tampones ya que podran Office manager. Para ayudar a curar la infeccin, use ropa interior recin lavada de algodn; no use ropa sinttica. Qu efectos secundarios puedo tener al [employer] este medicamento? Efectos secundarios que debe informar a su mdico o a Producer, television/film/video de la salud tan pronto como sea posible:  Therapist, art como erupcin cutnea, picazn o urticarias, hinchazn de la cara, labios o  lengua  dificultad o dolor al orinar  dolor vaginal Efectos secundarios que, por lo general, no requieren atencin mdica (debe informarlos a su mdico o a su profesional de la salud si persisten o si son molestos):  irritacin, picazn o ardor vaginal Puede ser que esta lista no menciona todos los posibles efectos secundarios. Comunquese a su mdico por asesoramiento mdico [employer]. Usted puede informar los efectos secundarios a la FDA por telfono al 1-800-FDA-1088. Dnde debo guardar mi medicina? Mantngala fuera del alcance de los nios. [place] a [employer], a menos de 30 grados C (86 grados F). No la congele. Deseche todo el medicamento que no haya utilizado, despus de la fecha de vencimiento. ATENCIN: Este folleto es un resumen. Puede ser que no cubra toda la posible informacin. Si usted tiene preguntas acerca de esta medicina, consulte con su mdico, su farmacutico o su profesional de Radiographer, therapeutic.  2021 Elsevier/Gold Standard (2014-02-19 00:00:00)

## 2020-02-26 NOTE — Progress Notes (Signed)
[facility]   Annual Physical  Subjective:  Patient ID: Pamela Wilkinson, female    DOB: 06/23/1975  Age: 45 y.o. MRN: [id number]  CC:  Chief Complaint  Patient presents with  . Follow-up    HPI Pamela Wilkinson is a 45 year old female who presents for Follow Up today.   Patient Active Problem List   Diagnosis Date Noted  . Urinary tract infection with hematuria 07/24/2018  . Vaginal discharge 07/24/2018  . Chronic intractable headache 07/17/2018  . Seasonal allergies 07/17/2018  . Bleeding nose 07/17/2018  . Bruising 07/17/2018  . Anxiety 07/17/2018  . Anemia 02/06/2013  . Pap smear for cervical cancer screening 11/06/2012  . Nipple pain 11/06/2012   Current Status: Since her last office visit, she has c/o vaginal discharge X 2 months now. She has not used any OTC medication for relief of her symptoms. Her last Pap Smear was 07/2018. She has never had a negative pap smear results. She also has occasional heart palpitations, with some incidents occurring at rest. Denies chest pain, heart palpitations, cough and shortness of breath reported. She has c/o of missed periods. Last was 15 days ago. She denies fevers, chills, fatigue, recent infections, weight loss, and night sweats. She has not had any headaches, visual changes, dizziness, and falls. Denies GI problems such as nausea, vomiting, diarrhea, and constipation. She has no reports of blood in stools, dysuria and hematuria. No depression or anxiety, and denies suicidal ideations, homicidal ideations, or auditory hallucinations. She is taking all medications as prescribed. She denies pain today.   Past Medical History:  Diagnosis Date  . Situational anxiety   . Vitamin D deficiency 07/2018    No past surgical history on file.  Family History  Problem Relation Age of Onset  . Hypertension Mother   . Heart disease Maternal Aunt     Social  History   Socioeconomic History  . Marital status: Married    Spouse name: Not on file  . Number of children: Not on file  . Years of education: Not on file  . Highest education level: Not on file  Occupational History  . Not on file  Tobacco Use  . Smoking status: Never Smoker  . Smokeless tobacco: Never Used  Vaping Use  . Vaping Use: Never used  Substance and Sexual Activity  . Alcohol use: Not Currently  . Drug use: No  . Sexual activity: Yes    Birth control/protection: None  Other Topics Concern  . Not on file  Social History Narrative  . Not on file   Social Determinants of Health   Financial Resource Strain: Not on file  Food Insecurity: Not on file  Transportation Needs: Not on file  Physical Activity: Not on file  Stress: Not on file  Social Connections: Not on file  Intimate Partner Violence: Not on file    Outpatient Medications Prior to Visit  Medication Sig Dispense Refill  . Probiotic Product (PROBIOTIC PO) Take by mouth.    Marland Kitchen. acetaminophen (TYLENOL) 500 MG tablet Take 500 mg by mouth every 6 (six) hours as needed for headache. (Patient not taking: Reported on 01/25/2020)    . busPIRone (BUSPAR) 7.5 MG tablet Take 1 tablet (7.5 mg total) by mouth 2 (two) times daily. (Patient not taking: Reported on 01/25/2020) 60 tablet 3  . cetirizine (ZYRTEC) 10 MG tablet Take 1 tablet (10 mg total) by mouth daily. (Patient  not taking: Reported on 01/25/2020) 30 tablet 11  . ferrous sulfate (FERROUSUL) 325 (65 FE) MG tablet Take 1 tablet (325 mg total) by mouth 3 (three) times daily with meals. (Patient not taking: Reported on 01/25/2020) 90 tablet 3  . naproxen (NAPROSYN) 500 MG tablet Take 1 tablet (500 mg total) by mouth 2 (two) times daily with a meal. (Patient not taking: Reported on 01/25/2020) 60 tablet 3  . topiramate (TOPAMAX) 25 MG tablet Take 1 tablet (25 mg total) by mouth 2 (two) times daily. (Patient not taking: Reported on 01/25/2020) 60 tablet 3  . Vitamin D,  Ergocalciferol, (DRISDOL) 1.25 MG (50000 UT) CAPS capsule Take 1 capsule (50,000 Units total) by mouth every 7 (seven) days. (Patient not taking: Reported on 01/25/2020) 5 capsule 3   No facility-administered medications prior to visit.    No Known Allergies  ROS Review of Systems  Constitutional: Negative.   HENT: Negative.   Eyes: Negative.   Respiratory: Negative.   Cardiovascular: Negative.   Gastrointestinal: Negative.   Endocrine: Negative.   Genitourinary: Negative.   Musculoskeletal: Negative.   Skin: Negative.   Allergic/Immunologic: Negative.   Neurological: Positive for dizziness (occasional ) and headaches (occasional ).  Hematological: Negative.   Psychiatric/Behavioral: Negative.       Objective:    Physical Exam Vitals and nursing note reviewed.  Constitutional:      Appearance: Normal appearance.  HENT:     Head: Normocephalic and atraumatic.     Nose: Nose normal.     Mouth/Throat:     Mouth: Mucous membranes are moist.     Pharynx: Oropharynx is clear.  Cardiovascular:     Rate and Rhythm: Normal rate and regular rhythm.     Pulses: Normal pulses.     Heart sounds: Normal heart sounds.  Pulmonary:     Effort: Pulmonary effort is normal.     Breath sounds: Normal breath sounds.  Abdominal:     General: Bowel sounds are normal.     Palpations: Abdomen is soft.  Musculoskeletal:        General: Normal range of motion.     Cervical back: Normal range of motion and neck supple.  Skin:    General: Skin is warm and dry.  Neurological:     General: No focal deficit present.     Mental Status: She is alert and oriented to person, place, and time.  Psychiatric:        Mood and Affect: Mood normal.        Behavior: Behavior normal.        Thought Content: Thought content normal.        Judgment: Judgment normal.     BP 118/62   Pulse 74   Ht 5' (1.524 m)   Wt 122 lb (55.3 kg)   SpO2 100%   BMI 23.83 kg/m  Wt Readings from Last 3 Encounters:   02/26/20 122 lb (55.3 kg)  07/24/18 119 lb (54 kg)  07/17/18 119 lb 3.2 oz (54.1 kg)     Health Maintenance Due  Topic Date Due  . TETANUS/TDAP  Never done  . INFLUENZA VACCINE  08/12/2019    There are no preventive care reminders to display for this patient.  Lab Results  Component Value Date   TSH 2.830 02/26/2020   Lab Results  Component Value Date   WBC 5.3 02/26/2020   HGB 11.1 02/26/2020   HCT 33.6 (L) 02/26/2020   MCV 89 02/26/2020  PLT 303 02/26/2020   Lab Results  Component Value Date   NA 137 02/26/2020   K 4.0 02/26/2020   CO2 20 02/26/2020   GLUCOSE 85 02/26/2020   BUN 10 02/26/2020   CREATININE 0.69 02/26/2020   BILITOT <0.2 02/26/2020   ALKPHOS 73 02/26/2020   AST 14 02/26/2020   ALT 9 02/26/2020   PROT 7.8 02/26/2020   ALBUMIN 5.0 (H) 02/26/2020   CALCIUM 9.6 02/26/2020   ANIONGAP 9 01/19/2016   Lab Results  Component Value Date   CHOL 231 (H) 02/26/2020   Lab Results  Component Value Date   HDL 73 02/26/2020   Lab Results  Component Value Date   LDLCALC 144 (H) 02/26/2020   Lab Results  Component Value Date   TRIG 80 02/26/2020   Lab Results  Component Value Date   CHOLHDL 3.2 02/26/2020   Lab Results  Component Value Date   HGBA1C 4.8 11/01/2017      Assessment & Plan:   1. Annual physical exam Physical assessment within normal for age. Basic Neurology assessment normal.  Follow-up for scheduled mammogram as needed.  Recommend monthly self breast exam Recommend daily multivitamin for women Recommend strength training in 150 minutes of cardiovascular exercise per week  2. Language barrier Video Hispanic Interpreter used today.   3. Chronic intractable headache, unspecified headache type  4. Missed periods - Ambulatory referral to Gynecology  5. Anxiety  6. Heart palpitations Stable today. Monitor.   7. Possible exposure to STD - STD Screen (8) - NuSwab Vaginitis Plus (VG+) - Urinalysis Dipstick  8.  Vaginal itching - fluconazole (DIFLUCAN) 150 MG tablet; Take 1 tablet (150 mg total) by mouth once for 1 dose.  Dispense: 1 tablet; Refill: 0 - Urinalysis Dipstick  9. Vaginal discharge - fluconazole (DIFLUCAN) 150 MG tablet; Take 1 tablet (150 mg total) by mouth once for 1 dose.  Dispense: 1 tablet; Refill: 0 - Urinalysis Dipstick  10. Healthcare maintenance - CBC with Differential - Comprehensive metabolic panel - Lipid Panel - TSH - Vitamin B12 - Vitamin D, 25-hydroxy - clotrimazole (CLOTRIMAZOLE 3) 2 % vaginal cream; Place 1 Applicatorful vaginally at bedtime.  Dispense: 21 g; Refill: 1 - Urinalysis Dipstick  11. Follow up She will follow up 07/2020 for Pap Smear.    Meds ordered this encounter  Medications  . fluconazole (DIFLUCAN) 150 MG tablet    Sig: Take 1 tablet (150 mg total) by mouth once for 1 dose.    Dispense:  1 tablet    Refill:  0  . clotrimazole (CLOTRIMAZOLE 3) 2 % vaginal cream    Sig: Place 1 Applicatorful vaginally at bedtime.    Dispense:  21 g    Refill:  1    Orders Placed This Encounter  Procedures  . CBC with Differential  . Comprehensive metabolic panel  . Lipid Panel  . TSH  . Vitamin B12  . Vitamin D, 25-hydroxy  . STD Screen (8)  . NuSwab Vaginitis Plus (VG+)  . Ambulatory referral to Gynecology  . Urinalysis Dipstick     Referral Orders     Ambulatory referral to Gynecology   Raliegh Ip, MSN, ANE, FNP-BC [facility] [address] [place], [place] [address] [phone] fax    Problem List Items Addressed This Visit      Other   Anxiety   Chronic intractable headache   Vaginal discharge   Relevant  Orders   Urinalysis Dipstick (Completed)    Other Visit Diagnoses    Annual physical exam    -  Primary   Language barrier       Missed periods       Relevant Orders   Ambulatory referral to Gynecology   Heart  palpitations       Possible exposure to STD       Relevant Orders   STD Screen (8) (Completed)   NuSwab Vaginitis Plus (VG+) (Completed)   Urinalysis Dipstick (Completed)   Vaginal itching       Relevant Orders   Urinalysis Dipstick (Completed)   Healthcare maintenance       Relevant Medications   clotrimazole (CLOTRIMAZOLE 3) 2 % vaginal cream   Other Relevant Orders   CBC with Differential (Completed)   Comprehensive metabolic panel (Completed)   Lipid Panel (Completed)   TSH (Completed)   Vitamin B12 (Completed)   Vitamin D, 25-hydroxy (Completed)   Urinalysis Dipstick (Completed)   Follow up          Meds ordered this encounter  Medications  . fluconazole (DIFLUCAN) 150 MG tablet    Sig: Take 1 tablet (150 mg total) by mouth once for 1 dose.    Dispense:  1 tablet    Refill:  0  . clotrimazole (CLOTRIMAZOLE 3) 2 % vaginal cream    Sig: Place 1 Applicatorful vaginally at bedtime.    Dispense:  21 g    Refill:  1    Follow-up: No follow-ups on file.    Kallie Locks, FNP

## 2020-02-27 LAB — STD SCREEN (8)
HIV Screen 4th Generation wRfx: NONREACTIVE
HSV 1 Glycoprotein G Ab, IgG: <0.91 index (ref 0.00–0.90)
HSV 2 IgG, Type Spec: >23.6 index — ABNORMAL HIGH (ref 0.00–0.90)
Hep A IgM: NEGATIVE
Hep B C IgM: NEGATIVE
Hep C Virus Ab: <0.1 s/co ratio (ref 0.0–0.9)
Hepatitis B Surface Ag: NEGATIVE
RPR Ser Ql: NONREACTIVE

## 2020-02-27 LAB — COMPREHENSIVE METABOLIC PANEL
ALT: 9 IU/L (ref 0–32)
AST: 14 IU/L (ref 0–40)
Albumin/Globulin Ratio: 1.8 (ref 1.2–2.2)
Albumin: 5 g/dL — ABNORMAL HIGH (ref 3.8–4.8)
Alkaline Phosphatase: 73 IU/L (ref 44–121)
BUN/Creatinine Ratio: 14 (ref 9–23)
BUN: 10 mg/dL (ref 6–24)
Bilirubin Total: <0.2 mg/dL (ref 0.0–1.2)
CO2: 20 mmol/L (ref 20–29)
Calcium: 9.6 mg/dL (ref 8.7–10.2)
Chloride: 100 mmol/L (ref 96–106)
Creatinine, Ser: 0.69 mg/dL (ref 0.57–1.00)
GFR calc Af Amer: 122 mL/min/{1.73_m2} (ref >59)
GFR calc non Af Amer: 106 mL/min/{1.73_m2} (ref >59)
Globulin, Total: 2.8 g/dL (ref 1.5–4.5)
Glucose: 85 mg/dL (ref 65–99)
Potassium: 4 mmol/L (ref 3.5–5.2)
Sodium: 137 mmol/L (ref 134–144)
Total Protein: 7.8 g/dL (ref 6.0–8.5)

## 2020-02-27 LAB — TSH: TSH: 2.83 u[IU]/mL (ref 0.450–4.500)

## 2020-02-27 LAB — CBC WITH DIFFERENTIAL/PLATELET
Basophils Absolute: 0.1 10*3/uL (ref 0.0–0.2)
Basos: 1 %
EOS (ABSOLUTE): 0.2 10*3/uL (ref 0.0–0.4)
Eos: 4 %
Hematocrit: 33.6 % — ABNORMAL LOW (ref 34.0–46.6)
Hemoglobin: 11.1 g/dL (ref 11.1–15.9)
Immature Grans (Abs): 0 10*3/uL (ref 0.0–0.1)
Immature Granulocytes: 0 %
Lymphocytes Absolute: 1.6 10*3/uL (ref 0.7–3.1)
Lymphs: 31 %
MCH: 29.4 pg (ref 26.6–33.0)
MCHC: 33 g/dL (ref 31.5–35.7)
MCV: 89 fL (ref 79–97)
Monocytes Absolute: 0.5 10*3/uL (ref 0.1–0.9)
Monocytes: 10 %
Neutrophils Absolute: 2.9 10*3/uL (ref 1.4–7.0)
Neutrophils: 54 %
Platelets: 303 10*3/uL (ref 150–450)
RBC: 3.78 x10E6/uL (ref 3.77–5.28)
RDW: 12.7 % (ref 11.7–15.4)
WBC: 5.3 10*3/uL (ref 3.4–10.8)

## 2020-02-27 LAB — LIPID PANEL
Chol/HDL Ratio: 3.2 ratio (ref 0.0–4.4)
Cholesterol, Total: 231 mg/dL — ABNORMAL HIGH (ref 100–199)
HDL: 73 mg/dL (ref >39)
LDL Chol Calc (NIH): 144 mg/dL — ABNORMAL HIGH (ref 0–99)
Triglycerides: 80 mg/dL (ref 0–149)
VLDL Cholesterol Cal: 14 mg/dL (ref 5–40)

## 2020-02-27 LAB — VITAMIN D 25 HYDROXY (VIT D DEFICIENCY, FRACTURES): Vit D, 25-Hydroxy: 12.6 ng/mL — ABNORMAL LOW (ref 30.0–100.0)

## 2020-02-27 LAB — VITAMIN B12: Vitamin B-12: 611 pg/mL (ref 232–1245)

## 2020-02-28 LAB — NUSWAB VAGINITIS PLUS (VG+)
Candida albicans, NAA: NEGATIVE
Candida glabrata, NAA: NEGATIVE
Chlamydia trachomatis, NAA: NEGATIVE
Neisseria gonorrhoeae, NAA: NEGATIVE
Trich vag by NAA: NEGATIVE

## 2020-03-01 ENCOUNTER — Encounter: Payer: Self-pay | Admitting: Family Medicine

## 2020-03-05 ENCOUNTER — Encounter: Payer: Self-pay | Admitting: Family Medicine

## 2020-03-05 ENCOUNTER — Other Ambulatory Visit: Payer: Self-pay | Admitting: Family Medicine

## 2020-03-05 DIAGNOSIS — E559 Vitamin D deficiency, unspecified: Secondary | ICD-10-CM

## 2020-03-05 MED ORDER — VITAMIN D (ERGOCALCIFEROL) 1.25 MG (50000 UNIT) PO CAPS: 50000.0000 [IU] | ORAL_CAPSULE | ORAL | 3 refills | Status: DC

## 2020-03-14 ENCOUNTER — Encounter: Payer: Self-pay | Admitting: Nurse Practitioner

## 2020-07-25 ENCOUNTER — Encounter: Payer: Self-pay | Admitting: Family Medicine

## 2020-07-25 ENCOUNTER — Ambulatory Visit: Payer: Self-pay | Admitting: Nurse Practitioner

## 2020-09-05 ENCOUNTER — Encounter: Payer: Self-pay | Admitting: Nurse Practitioner

## 2020-09-05 ENCOUNTER — Ambulatory Visit (INDEPENDENT_AMBULATORY_CARE_PROVIDER_SITE_OTHER): Payer: Self-pay | Admitting: Nurse Practitioner

## 2020-09-05 ENCOUNTER — Other Ambulatory Visit: Payer: Self-pay

## 2020-09-05 VITALS — BP 148/80 | HR 64 | Temp 98.1°F | Ht 61.0 in | Wt 126.0 lb

## 2020-09-05 DIAGNOSIS — B009 Herpesviral infection, unspecified: Secondary | ICD-10-CM

## 2020-09-05 DIAGNOSIS — Z01411 Encounter for gynecological examination (general) (routine) with abnormal findings: Secondary | ICD-10-CM

## 2020-09-05 DIAGNOSIS — E559 Vitamin D deficiency, unspecified: Secondary | ICD-10-CM

## 2020-09-05 DIAGNOSIS — Z0000 Encounter for general adult medical examination without abnormal findings: Secondary | ICD-10-CM

## 2020-09-05 DIAGNOSIS — Z01419 Encounter for gynecological examination (general) (routine) without abnormal findings: Secondary | ICD-10-CM

## 2020-09-05 DIAGNOSIS — E7849 Other hyperlipidemia: Secondary | ICD-10-CM

## 2020-09-05 LAB — POCT URINALYSIS DIPSTICK
Bilirubin, UA: NEGATIVE
Glucose, UA: NEGATIVE
Ketones, UA: NEGATIVE
Leukocytes, UA: NEGATIVE
Nitrite, UA: POSITIVE
Protein, UA: NEGATIVE
Spec Grav, UA: 1.02 (ref 1.010–1.025)
Urobilinogen, UA: 0.2 E.U./dL
pH, UA: 6 (ref 5.0–8.0)

## 2020-09-05 MED ORDER — VALACYCLOVIR HCL 500 MG PO TABS: 1000.0000 mg | ORAL_TABLET | Freq: Three times a day (TID) | ORAL | 0 refills | Status: DC

## 2020-09-05 NOTE — Patient Instructions (Signed)
You were seen today for PAP smear and vaginal symptoms. Labs were collected. We will contact you if results are abnormal. A prescription for valacyclovir was sent to the pharmacy, please take as prescribed. Please follow up in 4 wks for reevaluation of symptoms.

## 2020-09-05 NOTE — Progress Notes (Signed)
[facility] [address] [place], [place]  [address] Phone:  [phone]   Fax:  [phone] Subjective:   Patient ID: Pamela Wilkinson, female    DOB: 1975/03/04, 45 y.o.   MRN: [id number]  Chief Complaint  Patient presents with   Gynecologic Exam    PAP; Vaginal dryness, causing itching after intercourse. Starting having white discharge 1 week ago odorless.    HPI Pamela Wilkinson 45 y.o. female presents with history of vaginal discomfort and urinary tract infection to the [facility] for annual Pap smear.  Patient states that she has been having vaginal dryness with itching since February 2022.  1 week ago began having small amount of white discharge.  Denies any vaginal odor.  Patient states that she did not take prescribed medications in February, was unaware that she had prescription sent.  Symptoms worsen after sexual intercourse, but denies any pain during sex.  States that the vaginal discharge was malodorous at the initiation of symptoms, currently has no odor.  Has had an outbreak of bumps to the left labia x1 month..  States that it feels like a cyst.  Endorses having palpitations at times with anxiety but denies any other chest pain or cardiac symptoms.  Has had 2 partners in the past 6 months, 1 protected and one unprotected. Denies any dizziness, shortness of breath or blurred vision.  Past Medical History:  Diagnosis Date   Hyperlipidemia 02/2020   Situational anxiety    Vitamin D deficiency 07/2018    No past surgical history on file.  Family History  Problem Relation Age of Onset   Hypertension Mother    Heart disease Maternal Aunt     Social History   Socioeconomic History   Marital status: Married    Spouse name: Not on file   Number of children: Not on file   Years of education: Not on file   Highest education level: Not on file  Occupational History   Not on file  Tobacco Use   Smoking status: Never    Smokeless tobacco: Never  Vaping Use   Vaping Use: Never used  Substance and Sexual Activity   Alcohol use: Not Currently   Drug use: No   Sexual activity: Yes    Birth control/protection: None  Other Topics Concern   Not on file  Social History Narrative   Not on file   Social Determinants of Health   Financial Resource Strain: Not on file  Food Insecurity: Not on file  Transportation Needs: Not on file  Physical Activity: Not on file  Stress: Not on file  Social Connections: Not on file  Intimate Partner Violence: Not on file    Outpatient Medications Prior to Visit  Medication Sig Dispense Refill   Probiotic Product (PROBIOTIC PO) Take by mouth.     Vitamin D, Ergocalciferol, (DRISDOL) 1.25 MG (50000 UNIT) CAPS capsule Take 1 capsule (50,000 Units total) by mouth every 7 (seven) days. 5 capsule 3   clotrimazole (CLOTRIMAZOLE 3) 2 % vaginal cream Place 1 Applicatorful vaginally at bedtime. (Patient not taking: Reported on 09/05/2020) 21 g 1   No facility-administered medications prior to visit.    No Known Allergies  Review of Systems  Constitutional:  Negative for chills, fever and malaise/fatigue.  Respiratory:  Negative for cough and shortness of breath.   Cardiovascular:  Negative for chest pain, palpitations and leg swelling.  Gastrointestinal:  Negative for abdominal pain, blood in stool,  constipation, diarrhea, nausea and vomiting.  Genitourinary:        Vaginal dryness and itching with white discharge   Skin: Negative.   Psychiatric/Behavioral:  Negative for depression. The patient is not nervous/anxious.   All other systems reviewed and are negative.     Objective:    Physical Exam Vitals reviewed. Exam conducted with a chaperone present.  Constitutional:      General: She is not in acute distress.    Appearance: Normal appearance.  HENT:     Head: Normocephalic.  Cardiovascular:     Rate and Rhythm: Normal rate and regular rhythm.     Pulses:  Normal pulses.     Heart sounds: Normal heart sounds.     Comments: No obvious peripheral edema Pulmonary:     Effort: Pulmonary effort is normal.     Breath sounds: Normal breath sounds.  Chest:  Breasts:    Right: Normal. No swelling, inverted nipple, mass, nipple discharge, skin change or tenderness.     Left: Normal. No swelling, inverted nipple, mass, nipple discharge, skin change or tenderness.  Genitourinary:    Labia:        Right: No tenderness or lesion.        Left: No tenderness or lesion.      Vagina: Vaginal discharge present. No lesions.     Cervix: Discharge present. No cervical motion tenderness, lesion or erythema.     Uterus: Normal.      Comments: Small amount of thick white discharge Lymphadenopathy:     Upper Body:     Right upper body: No axillary adenopathy.     Left upper body: No axillary adenopathy.  Skin:    General: Skin is warm and dry.     Capillary Refill: Capillary refill takes less than 2 seconds.  Neurological:     Mental Status: She is alert.  Psychiatric:        Mood and Affect: Mood normal.        Behavior: Behavior normal.        Thought Content: Thought content normal.        Judgment: Judgment normal.    BP (!) 148/80 (BP Location: Right Arm, Patient Position: Sitting)   Pulse 64   Temp 98.1 F (36.7 C)   Ht 5\' 1"  (1.549 m)   Wt 126 lb (57.2 kg)   LMP 08/24/2020   SpO2 100%   BMI 23.81 kg/m  Wt Readings from Last 3 Encounters:  09/05/20 126 lb (57.2 kg)  02/26/20 122 lb (55.3 kg)  07/24/18 119 lb (54 kg)    Immunization History  Administered Date(s) Administered   Influenza,inj,Quad PF,6+ Mos 02/06/2013, 12/22/2015, 11/01/2017   PFIZER(Purple Top)SARS-COV-2 Vaccination 05/29/2019, 06/20/2019   Tdap 06/21/2018    Diabetic Foot Exam - Simple   No data filed     Lab Results  Component Value Date   TSH 2.830 02/26/2020   Lab Results  Component Value Date   WBC 5.3 02/26/2020   HGB 11.1 02/26/2020   HCT 33.6 (L)  02/26/2020   MCV 89 02/26/2020   PLT 303 02/26/2020   Lab Results  Component Value Date   NA 137 02/26/2020   K 4.0 02/26/2020   CO2 20 02/26/2020   GLUCOSE 85 02/26/2020   BUN 10 02/26/2020   CREATININE 0.69 02/26/2020   BILITOT <0.2 02/26/2020   ALKPHOS 73 02/26/2020   AST 14 02/26/2020   ALT 9 02/26/2020   PROT 7.8 02/26/2020  ALBUMIN 5.0 (H) 02/26/2020   CALCIUM 9.6 02/26/2020   ANIONGAP 9 01/19/2016   Lab Results  Component Value Date   CHOL 231 (H) 02/26/2020   CHOL 202 (H) 07/17/2018   CHOL 199 06/13/2017   Lab Results  Component Value Date   HDL 73 02/26/2020   HDL 65 07/17/2018   HDL 69 06/13/2017   Lab Results  Component Value Date   LDLCALC 144 (H) 02/26/2020   LDLCALC 113 (H) 07/17/2018   LDLCALC 115 (H) 06/13/2017   Lab Results  Component Value Date   TRIG 80 02/26/2020   TRIG 118 07/17/2018   TRIG 76 06/13/2017   Lab Results  Component Value Date   CHOLHDL 3.2 02/26/2020   CHOLHDL 3.1 07/17/2018   CHOLHDL 2.9 06/13/2017   Lab Results  Component Value Date   HGBA1C 4.8 11/01/2017   HGBA1C 4.8 10/28/2015       Assessment & Plan:   Problem List Items Addressed This Visit   None Visit Diagnoses     Annual physical exam    -  Primary   Relevant Orders   Urinalysis Dipstick (Completed)   Other hyperlipidemia       Relevant Orders   Lipid Panel Will review studies and treat accordingly   Vitamin D deficiency       Relevant Orders   Vitamin D, 25-hydroxy   Encounter for gynecological examination       Relevant Orders   Pap IG and Chlamydia/Gonococcus, NAA ([facility])   NuSwab BV and Candida, NAA   HIV antibody (with reflex)   HSV infection       Relevant Medications   valACYclovir (VALTREX) 500 MG tablet Discussed safe sex practices with patient and notifying partners    Encounter for gynecological examination with abnormal finding       Patient to follow up in 4 wks for reevaluation of symptoms    I have  discontinued Malachi Bonds Anel Galmiche Hernandez's Clotrimazole 3. I am also having her start on valACYclovir. Additionally, I am having her maintain her Probiotic Product (PROBIOTIC PO) and Vitamin D (Ergocalciferol).  Meds ordered this encounter  Medications   valACYclovir (VALTREX) 500 MG tablet    Sig: Take 2 tablets (1,000 mg total) by mouth 3 (three) times daily for 7 days.    Dispense:  42 tablet    Refill:  0     Kathrynn Speed, NP

## 2020-09-06 LAB — LIPID PANEL
Chol/HDL Ratio: 2.9 ratio (ref 0.0–4.4)
Cholesterol, Total: 217 mg/dL — ABNORMAL HIGH (ref 100–199)
HDL: 75 mg/dL (ref >39)
LDL Chol Calc (NIH): 125 mg/dL — ABNORMAL HIGH (ref 0–99)
Triglycerides: 96 mg/dL (ref 0–149)
VLDL Cholesterol Cal: 17 mg/dL (ref 5–40)

## 2020-09-06 LAB — HIV ANTIBODY (ROUTINE TESTING W REFLEX): HIV Screen 4th Generation wRfx: NONREACTIVE

## 2020-09-06 LAB — VITAMIN D 25 HYDROXY (VIT D DEFICIENCY, FRACTURES): Vit D, 25-Hydroxy: 15.9 ng/mL — ABNORMAL LOW (ref 30.0–100.0)

## 2020-09-07 LAB — NUSWAB BV AND CANDIDA, NAA
Atopobium vaginae: HIGH Score — AB
BVAB 2: HIGH Score — AB
Candida albicans, NAA: NEGATIVE
Candida glabrata, NAA: NEGATIVE
Megasphaera 1: HIGH Score — AB

## 2020-09-08 ENCOUNTER — Other Ambulatory Visit: Payer: Self-pay | Admitting: Nurse Practitioner

## 2020-09-08 DIAGNOSIS — E7849 Other hyperlipidemia: Secondary | ICD-10-CM

## 2020-09-08 DIAGNOSIS — E559 Vitamin D deficiency, unspecified: Secondary | ICD-10-CM

## 2020-09-08 DIAGNOSIS — B9689 Other specified bacterial agents as the cause of diseases classified elsewhere: Secondary | ICD-10-CM

## 2020-09-08 MED ORDER — METRONIDAZOLE 500 MG PO TABS: 500.0000 mg | ORAL_TABLET | Freq: Two times a day (BID) | ORAL | 0 refills | Status: AC

## 2020-09-08 MED ORDER — VITAMIN D (ERGOCALCIFEROL) 1.25 MG (50000 UNIT) PO CAPS: 50000.0000 [IU] | ORAL_CAPSULE | ORAL | 0 refills | Status: AC

## 2020-09-08 MED ORDER — ATORVASTATIN CALCIUM 40 MG PO TABS: 40.0000 mg | ORAL_TABLET | Freq: Every day | ORAL | 1 refills | Status: DC

## 2020-09-09 LAB — PAP IG AND CT-NG NAA
Chlamydia, Nuc. Acid Amp: NEGATIVE
Gonococcus by Nucleic Acid Amp: NEGATIVE

## 2020-10-17 ENCOUNTER — Other Ambulatory Visit: Payer: Self-pay

## 2020-10-17 ENCOUNTER — Ambulatory Visit (INDEPENDENT_AMBULATORY_CARE_PROVIDER_SITE_OTHER): Payer: Self-pay | Admitting: Nurse Practitioner

## 2020-10-17 ENCOUNTER — Ambulatory Visit: Payer: Self-pay | Admitting: Nurse Practitioner

## 2020-10-17 ENCOUNTER — Encounter: Payer: Self-pay | Admitting: Nurse Practitioner

## 2020-10-17 VITALS — BP 119/62 | HR 70 | Temp 97.5°F | Ht 61.0 in | Wt 128.0 lb

## 2020-10-17 DIAGNOSIS — R519 Headache, unspecified: Secondary | ICD-10-CM

## 2020-10-17 DIAGNOSIS — N926 Irregular menstruation, unspecified: Secondary | ICD-10-CM

## 2020-10-17 DIAGNOSIS — B009 Herpesviral infection, unspecified: Secondary | ICD-10-CM

## 2020-10-17 DIAGNOSIS — R220 Localized swelling, mass and lump, head: Secondary | ICD-10-CM

## 2020-10-17 MED ORDER — VALACYCLOVIR HCL 500 MG PO TABS
1000.0000 mg | ORAL_TABLET | Freq: Three times a day (TID) | ORAL | 0 refills | Status: AC
  Filled 2020-10-17: qty 42, 7d supply, fill #0

## 2020-10-17 NOTE — Patient Instructions (Signed)
You were seen today in the [facility] for reevaluation of symptoms.  You were prescribed medications, please take as directed. Please follow up in 2 mths for reevaluation.

## 2020-10-17 NOTE — Progress Notes (Signed)
[facility] Patient [facility] [address], [place]  [address] Phone:  [phone]   Fax:  [phone] Subjective:   Patient ID: Pamela Wilkinson, female    DOB: [birth year], 45 y.o.   MRN: [id number]  Chief Complaint  Patient presents with   Follow-up    6 week follow up; still having some vaginal itchiness but not as frequent. No pain or burning.     HPI Pamela Wilkinson 45 y.o. female with history hyperlipidemia and HSV to the [facility] for follow up and lump on head.   Patient states that she has been compliant with all medications and completed valtrex therapy. Continues to have mild vaginal itching, that occurs intermittently. Currently has 1 partner, her husband. States that her husband was evaluated and treated for HSV. Denies any other vaginal symptoms.States that she has had changes in menstrual cycle, with no cycle some months. Currently on her cycle. Suspects she may be having early menopause. Off and on menstrual cycle x 1 yr.  Also concerned about lump/mass in scalp x 6 mths. Since mass developed she has had intermittent HA's and pain when lying on affected area. Also endorses some itching. Mass has somewhat increased in size. Has not taken any medications for symptoms. Denies any recent trauma or injury.   Denies any fever. Denies any fatigue, chest pain, shortness of breath, HA or dizziness. Denies any blurred vision, numbness or tingling. Denies any other complaints today.     Past Medical History:  Diagnosis Date   Hyperlipidemia 02/2020   Situational anxiety    Vitamin D deficiency 07/2018    No past surgical history on file.  Family History  Problem Relation Age of Onset   Hypertension Mother    Heart disease Maternal Aunt     Social History   Socioeconomic History   Marital status: Married    Spouse name: Not on file   Number of children: Not on file   Years of education: Not on file   Highest education level:  Not on file  Occupational History   Not on file  Tobacco Use   Smoking status: Never   Smokeless tobacco: Never  Vaping Use   Vaping Use: Never used  Substance and Sexual Activity   Alcohol use: Not Currently   Drug use: No   Sexual activity: Yes    Birth control/protection: None  Other Topics Concern   Not on file  Social History Narrative   Not on file   Social Determinants of Health   Financial Resource Strain: Not on file  Food Insecurity: Not on file  Transportation Needs: Not on file  Physical Activity: Not on file  Stress: Not on file  Social Connections: Not on file  Intimate Partner Violence: Not on file    Outpatient Medications Prior to Visit  Medication Sig Dispense Refill   atorvastatin (LIPITOR) 40 MG tablet Take 1 tablet (40 mg total) by mouth daily. 30 tablet 1   Probiotic Product (PROBIOTIC PO) Take by mouth.     Vitamin D, Ergocalciferol, (DRISDOL) 1.25 MG (50000 UNIT) CAPS capsule Take 1 capsule (50,000 Units total) by mouth every 7 (seven) days for 8 doses. 8 capsule 0   No facility-administered medications prior to visit.    No Known Allergies  Review of Systems  Constitutional:  Negative for chills, fever and malaise/fatigue.  HENT: Negative.    Respiratory:  Negative for cough and shortness of breath.   Cardiovascular:  Negative for chest pain, palpitations and leg swelling.  Gastrointestinal:  Negative for abdominal pain, blood in stool, constipation, diarrhea, nausea and vomiting.  Genitourinary:        See HPI  Musculoskeletal: Negative.   Skin:        See HPI  Neurological:  Positive for headaches.  Psychiatric/Behavioral:  Negative for depression. The patient is not nervous/anxious.   All other systems reviewed and are negative.     Objective:    Physical Exam Vitals reviewed.  Constitutional:      General: She is not in acute distress.    Appearance: Normal appearance.  HENT:     Head: Normocephalic.     Right Ear: Tympanic  membrane, ear canal and external ear normal.     Left Ear: Tympanic membrane, ear canal and external ear normal.     Nose: Nose normal.     Mouth/Throat:     Mouth: Mucous membranes are moist.     Pharynx: Oropharynx is clear.  Eyes:     Extraocular Movements: Extraocular movements intact.     Conjunctiva/sclera: Conjunctivae normal.     Pupils: Pupils are equal, round, and reactive to light.  Cardiovascular:     Rate and Rhythm: Normal rate and regular rhythm.     Pulses: Normal pulses.     Heart sounds: Normal heart sounds.     Comments: No obvious peripheral edema Pulmonary:     Effort: Pulmonary effort is normal.     Breath sounds: Normal breath sounds.  Musculoskeletal:     Cervical back: Normal range of motion and neck supple.  Skin:    General: Skin is warm and dry.     Capillary Refill: Capillary refill takes less than 2 seconds.     Comments: 1 cm fix mass noted to right lateral scalp, above ear. Mass is non fluctuant and non tender to palpation. Mild erythema noted, no other discoloration.   Neurological:     General: No focal deficit present.     Mental Status: She is alert and oriented to person, place, and time.  Psychiatric:        Mood and Affect: Mood normal.        Behavior: Behavior normal.        Thought Content: Thought content normal.        Judgment: Judgment normal.    BP 119/62   Pulse 70   Temp (!) 97.5 F (36.4 C)   Ht 5\' 1"  (1.549 m)   Wt 128 lb 0.6 oz (58.1 kg)   LMP 10/10/2020   SpO2 98%   BMI 24.19 kg/m  Wt Readings from Last 3 Encounters:  10/17/20 128 lb 0.6 oz (58.1 kg)  09/05/20 126 lb (57.2 kg)  02/26/20 122 lb (55.3 kg)    Immunization History  Administered Date(s) Administered   Influenza,inj,Quad PF,6+ Mos 02/06/2013, 12/22/2015, 11/01/2017   PFIZER(Purple Top)SARS-COV-2 Vaccination 05/29/2019, 06/20/2019   Tdap 06/21/2018    Diabetic Foot Exam - Simple   No data filed     Lab Results  Component Value Date   TSH  2.830 02/26/2020   Lab Results  Component Value Date   WBC 5.3 02/26/2020   HGB 11.1 02/26/2020   HCT 33.6 (L) 02/26/2020   MCV 89 02/26/2020   PLT 303 02/26/2020   Lab Results  Component Value Date   NA 137 02/26/2020   K 4.0 02/26/2020   CO2 20 02/26/2020   GLUCOSE 85 02/26/2020  BUN 10 02/26/2020   CREATININE 0.69 02/26/2020   BILITOT <0.2 02/26/2020   ALKPHOS 73 02/26/2020   AST 14 02/26/2020   ALT 9 02/26/2020   PROT 7.8 02/26/2020   ALBUMIN 5.0 (H) 02/26/2020   CALCIUM 9.6 02/26/2020   ANIONGAP 9 01/19/2016   Lab Results  Component Value Date   CHOL 217 (H) 09/05/2020   CHOL 231 (H) 02/26/2020   CHOL 202 (H) 07/17/2018   Lab Results  Component Value Date   HDL 75 09/05/2020   HDL 73 02/26/2020   HDL 65 07/17/2018   Lab Results  Component Value Date   LDLCALC 125 (H) 09/05/2020   LDLCALC 144 (H) 02/26/2020   LDLCALC 113 (H) 07/17/2018   Lab Results  Component Value Date   TRIG 96 09/05/2020   TRIG 80 02/26/2020   TRIG 118 07/17/2018   Lab Results  Component Value Date   CHOLHDL 2.9 09/05/2020   CHOLHDL 3.2 02/26/2020   CHOLHDL 3.1 07/17/2018   Lab Results  Component Value Date   HGBA1C 4.8 11/01/2017   HGBA1C 4.8 10/28/2015       Assessment & Plan:   Problem List Items Addressed This Visit   None Visit Diagnoses     Nonintractable headache, unspecified chronicity pattern, unspecified headache type    -  Primary   Relevant Orders   CT HEAD WO CONTRAST ( ) Given patient history and physical exam, concerned for complex underlying etiology v superficial skin lesion with unrelated intermittent HA's. CT ordered. Recently completed lab panel wnl and reassuring.    HSV infection       Relevant Medications   valACYclovir (VALTREX) 500 MG tablet Discussed safe sex practices Informed patient that if symptoms persist after completion of medications to notify clinic, may require long-term/suppressive therapy   Abnormal menstrual cycle      Informed that symptoms likely premenopausal in nature. Patient to notify clinic if additional symptoms develop.    Scalp mass       Follow up in 2 mths for reevaluation of symptoms and completion of lipid panel, sooner as needed    I am having Joneen Caraway Francene Castle maintain her Probiotic Product (PROBIOTIC PO), atorvastatin, Vitamin D (Ergocalciferol), and valACYclovir.  Meds ordered this encounter  Medications   valACYclovir (VALTREX) 500 MG tablet    Sig: Take 2 tablets (1,000 mg total) by mouth 3 (three) times daily for 7 days.    Dispense:  42 tablet    Refill:  0     Kathrynn Speed, NP

## 2020-10-24 ENCOUNTER — Other Ambulatory Visit: Payer: Self-pay

## 2020-12-17 ENCOUNTER — Ambulatory Visit (INDEPENDENT_AMBULATORY_CARE_PROVIDER_SITE_OTHER): Payer: Self-pay | Admitting: Nurse Practitioner

## 2020-12-17 ENCOUNTER — Encounter: Payer: Self-pay | Admitting: Nurse Practitioner

## 2020-12-17 ENCOUNTER — Other Ambulatory Visit: Payer: Self-pay

## 2020-12-17 VITALS — BP 133/72 | HR 62 | Temp 98.2°F | Ht 61.0 in | Wt 125.5 lb

## 2020-12-17 DIAGNOSIS — E7849 Other hyperlipidemia: Secondary | ICD-10-CM

## 2020-12-17 DIAGNOSIS — L659 Nonscarring hair loss, unspecified: Secondary | ICD-10-CM

## 2020-12-17 DIAGNOSIS — N898 Other specified noninflammatory disorders of vagina: Secondary | ICD-10-CM

## 2020-12-17 MED ORDER — ATORVASTATIN CALCIUM 40 MG PO TABS: 40.0000 mg | ORAL_TABLET | Freq: Every day | ORAL | 5 refills | Status: DC

## 2020-12-17 NOTE — Progress Notes (Signed)
[facility] Patient [facility] [address] [place], [place]  [address] Phone:  [phone]   Fax:  [phone] Subjective:   Patient ID: Pamela Wilkinson, female    DOB: 1975-07-01, 45 y.o.   MRN: [id number]  Chief Complaint  Patient presents with   Follow-up    Pt is here for a FU. Pt stated she had her menstrual 2x in one month and she has a foul smell at the end of her menstrual. Pt stated she is also concern about hair loss.   HPI Pamela Wilkinson 45 y.o. female  has a past medical history of Hyperlipidemia (02/2020), Situational anxiety, and Vitamin D deficiency (07/2018).  To the [facility] for follow up of hyperlipidemia. Also concerned about recent hair loss and vaginal odor.  Patient states that she has had hair loss x 5 mths. Indicates that hair has been coming out in large clumps. Denies any pain or scalp abnormalities. States that he mother had similar hair loss, but never received treatment. Denies any new medications.  Also concerned about vaginal odor after menstrual cycle, in addition to having two menstrual cycles in 1 mth. States that she had menstrual cycle start on 10/28 and again on 11/15. Has not had any vaginal bleeding since 11/15. Endorses vaginal odor after last cycle that is no longer present. Denies any other vaginal symptoms or discharge. Has had no new partners outside of her husband since last visit. Denies any abdominal or low back pain.  Denies any other symptoms today. Denies any fatigue, chest pain, shortness of breath, HA or dizziness. Denies any blurred vision, numbness or tingling. Has been compliant with all medications and continues to monitor diet and/ exercise habits.   Past Medical History:  Diagnosis Date   Hyperlipidemia 02/2020   Situational anxiety    Vitamin D deficiency 07/2018    History reviewed. No pertinent surgical history.  Family History  Problem Relation Age of Onset   Hypertension Mother     Heart disease Maternal Aunt     Social History   Socioeconomic History   Marital status: Married    Spouse name: Not on file   Number of children: Not on file   Years of education: Not on file   Highest education level: Not on file  Occupational History   Not on file  Tobacco Use   Smoking status: Never   Smokeless tobacco: Never  Vaping Use   Vaping Use: Never used  Substance and Sexual Activity   Alcohol use: Not Currently   Drug use: No   Sexual activity: Yes    Birth control/protection: None  Other Topics Concern   Not on file  Social History Narrative   Not on file   Social Determinants of Health   Financial Resource Strain: Not on file  Food Insecurity: Not on file  Transportation Needs: Not on file  Physical Activity: Not on file  Stress: Not on file  Social Connections: Not on file  Intimate Partner Violence: Not on file    Outpatient Medications Prior to Visit  Medication Sig Dispense Refill   Probiotic Product (PROBIOTIC PO) Take by mouth. (Patient not taking: Reported on 12/17/2020)     atorvastatin (LIPITOR) 40 MG tablet Take 1 tablet (40 mg total) by mouth daily. 30 tablet 1   No facility-administered medications prior to visit.    No Known Allergies  Review of Systems  Constitutional:  Negative for chills, fever and malaise/fatigue.  Respiratory:  Negative for cough and shortness of breath.   Cardiovascular:  Negative for chest pain, palpitations and leg swelling.  Gastrointestinal:  Negative for abdominal pain, blood in stool, constipation, diarrhea, nausea and vomiting.  Genitourinary:        See HPI  Skin:        See HPI  Psychiatric/Behavioral:  Negative for depression. The patient is not nervous/anxious.   All other systems reviewed and are negative.     Objective:    Physical Exam Vitals reviewed.  Constitutional:      General: She is not in acute distress.    Appearance: Normal appearance. She is normal weight.  HENT:     Head:  Normocephalic.  Cardiovascular:     Rate and Rhythm: Normal rate and regular rhythm.     Pulses: Normal pulses.     Heart sounds: Normal heart sounds.     Comments: No obvious peripheral edema Pulmonary:     Effort: Pulmonary effort is normal.     Breath sounds: Normal breath sounds.  Musculoskeletal:     Cervical back: Normal range of motion and neck supple.  Skin:    General: Skin is warm and dry.     Capillary Refill: Capillary refill takes less than 2 seconds.     Comments: No noted scalp abnormalities or large areas of hair loss   Neurological:     General: No focal deficit present.     Mental Status: She is alert and oriented to person, place, and time.  Psychiatric:        Mood and Affect: Mood normal.        Behavior: Behavior normal.        Thought Content: Thought content normal.        Judgment: Judgment normal.    BP 133/72   Pulse 62   Temp 98.2 F (36.8 C)   Ht 5\' 1"  (1.549 m)   Wt 125 lb 8 oz (56.9 kg)   LMP 11/25/2020 (Approximate)   SpO2 100%   BMI 23.71 kg/m  Wt Readings from Last 3 Encounters:  12/17/20 125 lb 8 oz (56.9 kg)  10/17/20 128 lb 0.6 oz (58.1 kg)  09/05/20 126 lb (57.2 kg)    Immunization History  Administered Date(s) Administered   Influenza,inj,Quad PF,6+ Mos 02/06/2013, 12/22/2015, 11/01/2017   PFIZER(Purple Top)SARS-COV-2 Vaccination 05/29/2019, 06/20/2019   Tdap 06/21/2018    Diabetic Foot Exam - Simple   No data filed     Lab Results  Component Value Date   TSH 2.830 02/26/2020   Lab Results  Component Value Date   WBC 5.3 02/26/2020   HGB 11.1 02/26/2020   HCT 33.6 (L) 02/26/2020   MCV 89 02/26/2020   PLT 303 02/26/2020   Lab Results  Component Value Date   NA 137 02/26/2020   K 4.0 02/26/2020   CO2 20 02/26/2020   GLUCOSE 85 02/26/2020   BUN 10 02/26/2020   CREATININE 0.69 02/26/2020   BILITOT <0.2 02/26/2020   ALKPHOS 73 02/26/2020   AST 14 02/26/2020   ALT 9 02/26/2020   PROT 7.8 02/26/2020    ALBUMIN 5.0 (H) 02/26/2020   CALCIUM 9.6 02/26/2020   ANIONGAP 9 01/19/2016   Lab Results  Component Value Date   CHOL 217 (H) 09/05/2020   CHOL 231 (H) 02/26/2020   CHOL 202 (H) 07/17/2018   Lab Results  Component Value Date   HDL 75 09/05/2020   HDL 73 02/26/2020   HDL 65  07/17/2018   Lab Results  Component Value Date   LDLCALC 125 (H) 09/05/2020   LDLCALC 144 (H) 02/26/2020   LDLCALC 113 (H) 07/17/2018   Lab Results  Component Value Date   TRIG 96 09/05/2020   TRIG 80 02/26/2020   TRIG 118 07/17/2018   Lab Results  Component Value Date   CHOLHDL 2.9 09/05/2020   CHOLHDL 3.2 02/26/2020   CHOLHDL 3.1 07/17/2018   Lab Results  Component Value Date   HGBA1C 4.8 11/01/2017   HGBA1C 4.8 10/28/2015       Assessment & Plan:   Problem List Items Addressed This Visit   None Visit Diagnoses     Other hyperlipidemia    -  Primary   Relevant Medications   atorvastatin (LIPITOR) 40 MG tablet, refilled during visit    Other Relevant Orders   Lipid panel Encouraged continued diet and exercise efforts  Encouraged continued compliance with medication    Hair loss       Relevant Orders   CBC with Differential/Platelet   Comprehensive metabolic panel   EHM+[id number]   Iron, TIBC and Ferritin Panel   Ambulatory referral to Dermatology Reassuring HPI and PE, labs ordered to rule out other etiologies v genetic predisposition   Vaginal odor       Relevant Orders   NuSwab Vaginitis Plus (VG+)   Follow up in 6 mths for reevaluation of hyperlipidemia and symptoms discussed during today's visit, sooner as needed     I am having Purvis Sheffield maintain her Probiotic Product (PROBIOTIC PO) and atorvastatin.  Meds ordered this encounter  Medications   atorvastatin (LIPITOR) 40 MG tablet    Sig: Take 1 tablet (40 mg total) by mouth daily.    Dispense:  30 tablet    Refill:  5     Kathrynn Speed, NP

## 2020-12-17 NOTE — Patient Instructions (Signed)
You were seen today in the [facility] for follow up on cholesterol, hair loss and vaginal concerns. Labs were collected, results will be available via MyChart or, if abnormal, you will be contacted by clinic staff. You were prescribed medications, please take as directed. Please follow up in 6 mths for reevaluation.

## 2020-12-18 LAB — CBC WITH DIFFERENTIAL/PLATELET
Basophils Absolute: 0 10*3/uL (ref 0.0–0.2)
Basos: 1 %
EOS (ABSOLUTE): 0.1 10*3/uL (ref 0.0–0.4)
Eos: 3 %
Hematocrit: 35.1 % (ref 34.0–46.6)
Hemoglobin: 11.4 g/dL (ref 11.1–15.9)
Immature Grans (Abs): 0 10*3/uL (ref 0.0–0.1)
Immature Granulocytes: 0 %
Lymphocytes Absolute: 1.6 10*3/uL (ref 0.7–3.1)
Lymphs: 38 %
MCH: 29.5 pg (ref 26.6–33.0)
MCHC: 32.5 g/dL (ref 31.5–35.7)
MCV: 91 fL (ref 79–97)
Monocytes Absolute: 0.4 10*3/uL (ref 0.1–0.9)
Monocytes: 10 %
Neutrophils Absolute: 2 10*3/uL (ref 1.4–7.0)
Neutrophils: 48 %
Platelets: 273 10*3/uL (ref 150–450)
RBC: 3.87 x10E6/uL (ref 3.77–5.28)
RDW: 13.2 % (ref 11.7–15.4)
WBC: 4.1 10*3/uL (ref 3.4–10.8)

## 2020-12-18 LAB — COMPREHENSIVE METABOLIC PANEL
ALT: 15 IU/L (ref 0–32)
AST: 21 IU/L (ref 0–40)
Albumin/Globulin Ratio: 1.8 (ref 1.2–2.2)
Albumin: 4.5 g/dL (ref 3.8–4.8)
Alkaline Phosphatase: 67 IU/L (ref 44–121)
BUN/Creatinine Ratio: 10 (ref 9–23)
BUN: 8 mg/dL (ref 6–24)
Bilirubin Total: 0.3 mg/dL (ref 0.0–1.2)
CO2: 24 mmol/L (ref 20–29)
Calcium: 9.3 mg/dL (ref 8.7–10.2)
Chloride: 104 mmol/L (ref 96–106)
Creatinine, Ser: 0.77 mg/dL (ref 0.57–1.00)
Globulin, Total: 2.5 g/dL (ref 1.5–4.5)
Glucose: 87 mg/dL (ref 70–99)
Potassium: 4.4 mmol/L (ref 3.5–5.2)
Sodium: 140 mmol/L (ref 134–144)
Total Protein: 7 g/dL (ref 6.0–8.5)
eGFR: 97 mL/min/{1.73_m2} (ref >59)

## 2020-12-18 LAB — LIPID PANEL
Chol/HDL Ratio: 3.2 ratio (ref 0.0–4.4)
Cholesterol, Total: 219 mg/dL — ABNORMAL HIGH (ref 100–199)
HDL: 69 mg/dL (ref >39)
LDL Chol Calc (NIH): 131 mg/dL — ABNORMAL HIGH (ref 0–99)
Triglycerides: 108 mg/dL (ref 0–149)
VLDL Cholesterol Cal: 19 mg/dL (ref 5–40)

## 2020-12-18 LAB — IRON,TIBC AND FERRITIN PANEL
Ferritin: 7 ng/mL — ABNORMAL LOW (ref 15–150)
Iron Saturation: 10 % — ABNORMAL LOW (ref 15–55)
Iron: 39 ug/dL (ref 27–159)
Total Iron Binding Capacity: 381 ug/dL (ref 250–450)
UIBC: 342 ug/dL (ref 131–425)

## 2020-12-18 LAB — TSH+T4F+T3FREE
Free T4: 1.02 ng/dL (ref 0.82–1.77)
T3, Free: 2.9 pg/mL (ref 2.0–4.4)
TSH: 2.46 u[IU]/mL (ref 0.450–4.500)

## 2020-12-20 LAB — NUSWAB VAGINITIS PLUS (VG+)
Atopobium vaginae: HIGH Score — AB
BVAB 2: HIGH Score — AB
Candida albicans, NAA: NEGATIVE
Candida glabrata, NAA: NEGATIVE
Chlamydia trachomatis, NAA: NEGATIVE
Megasphaera 1: HIGH Score — AB
Neisseria gonorrhoeae, NAA: NEGATIVE
Trich vag by NAA: NEGATIVE

## 2020-12-22 ENCOUNTER — Other Ambulatory Visit: Payer: Self-pay | Admitting: Nurse Practitioner

## 2020-12-22 DIAGNOSIS — B9689 Other specified bacterial agents as the cause of diseases classified elsewhere: Secondary | ICD-10-CM

## 2020-12-22 DIAGNOSIS — D508 Other iron deficiency anemias: Secondary | ICD-10-CM

## 2020-12-22 DIAGNOSIS — T887XXA Unspecified adverse effect of drug or medicament, initial encounter: Secondary | ICD-10-CM

## 2020-12-22 MED ORDER — FERROUS SULFATE 325 (65 FE) MG PO TABS: 325.0000 mg | ORAL_TABLET | Freq: Every day | ORAL | 2 refills | Status: DC

## 2020-12-22 MED ORDER — DOCUSATE SODIUM 100 MG PO CAPS: 100.0000 mg | ORAL_CAPSULE | Freq: Two times a day (BID) | ORAL | 0 refills | Status: DC | PRN

## 2020-12-22 MED ORDER — METRONIDAZOLE 500 MG PO TABS
500.0000 mg | ORAL_TABLET | Freq: Two times a day (BID) | ORAL | 0 refills | Status: AC
Start: 2020-12-22 — End: 2020-12-29

## 2020-12-26 ENCOUNTER — Telehealth: Payer: Self-pay

## 2020-12-26 NOTE — Telephone Encounter (Signed)
Vm not set up, unable to reach patient.

## 2020-12-26 NOTE — Telephone Encounter (Signed)
Pt called and said that she has went to [facility] on [address] for [facility] and nothing was there.   Can you check please and let the pt know

## 2021-01-12 ENCOUNTER — Emergency Department (HOSPITAL_COMMUNITY)
Admission: EM | Admit: 2021-01-12 | Discharge: 2021-01-12 | Disposition: A | Discharge status: Home / Self Care | Payer: Self-pay | Attending: Emergency Medicine | Admitting: Emergency Medicine

## 2021-01-12 ENCOUNTER — Encounter (HOSPITAL_COMMUNITY): Payer: Self-pay

## 2021-01-12 ENCOUNTER — Other Ambulatory Visit: Payer: Self-pay

## 2021-01-12 DIAGNOSIS — Z5321 Procedure and treatment not carried out due to patient leaving prior to being seen by health care provider: Secondary | ICD-10-CM | POA: Insufficient documentation

## 2021-01-12 DIAGNOSIS — R21 Rash and other nonspecific skin eruption: Secondary | ICD-10-CM | POA: Insufficient documentation

## 2021-01-12 NOTE — ED Provider Notes (Signed)
Emergency Medicine Provider Triage Evaluation Note  Pamela Wilkinson , a 46 y.o. female  was evaluated in triage.  Pt complains of rash to hand, breast, and back that started last night after taking Flanax. She notes she has taken the medication before with no reaction. Denies difficulties breathing or feelings like her throat is closing up. Rash associated with pruritis.   Review of Systems  Positive: rash Negative: fever  Physical Exam  BP (!) 156/95 (BP Location: Left Arm)    Pulse 75    Temp 98.3 F (36.8 C) (Oral)    Resp 14    SpO2 100%  Gen:   Awake, no distress   Resp:  Normal effort  MSK:   Moves extremities without difficulty  Other:  Rash to right hand, left breast, and upper back  Medical Decision Making  Medically screening exam initiated at 4:32 PM.  Appropriate orders placed.  Pamela Wilkinson was informed that the remainder of the evaluation will be completed by another provider, this initial triage assessment does not replace that evaluation, and the importance of remaining in the ED until their evaluation is complete.  Rash   Pamela Wilkinson 01/12/21 1635    Charlynne Pander, MD 01/12/21 2157

## 2021-01-12 NOTE — ED Triage Notes (Signed)
Pt reports a itchy rash that appeared yesterday. She has a spot on her right hand, left breast, and right back.

## 2021-06-17 ENCOUNTER — Ambulatory Visit: Payer: Self-pay | Admitting: Nurse Practitioner

## 2021-06-24 ENCOUNTER — Encounter: Payer: Self-pay | Admitting: Nurse Practitioner

## 2021-06-24 ENCOUNTER — Ambulatory Visit (INDEPENDENT_AMBULATORY_CARE_PROVIDER_SITE_OTHER): Payer: Self-pay | Admitting: Physician Assistant

## 2021-06-24 ENCOUNTER — Encounter: Payer: Self-pay | Admitting: Physician Assistant

## 2021-06-24 ENCOUNTER — Ambulatory Visit (INDEPENDENT_AMBULATORY_CARE_PROVIDER_SITE_OTHER): Payer: Self-pay | Admitting: Nurse Practitioner

## 2021-06-24 VITALS — BP 149/89 | HR 66 | Temp 98.8°F | Ht 61.0 in | Wt 124.8 lb

## 2021-06-24 DIAGNOSIS — R531 Weakness: Secondary | ICD-10-CM

## 2021-06-24 DIAGNOSIS — D508 Other iron deficiency anemias: Secondary | ICD-10-CM

## 2021-06-24 DIAGNOSIS — R7303 Prediabetes: Secondary | ICD-10-CM

## 2021-06-24 DIAGNOSIS — R030 Elevated blood-pressure reading, without diagnosis of hypertension: Secondary | ICD-10-CM

## 2021-06-24 DIAGNOSIS — L821 Other seborrheic keratosis: Secondary | ICD-10-CM

## 2021-06-24 DIAGNOSIS — R5383 Other fatigue: Secondary | ICD-10-CM

## 2021-06-24 DIAGNOSIS — T887XXA Unspecified adverse effect of drug or medicament, initial encounter: Secondary | ICD-10-CM

## 2021-06-24 DIAGNOSIS — E7849 Other hyperlipidemia: Secondary | ICD-10-CM

## 2021-06-24 DIAGNOSIS — H02846 Edema of left eye, unspecified eyelid: Secondary | ICD-10-CM

## 2021-06-24 DIAGNOSIS — G4709 Other insomnia: Secondary | ICD-10-CM

## 2021-06-24 MED ORDER — TRIAMCINOLONE ACETONIDE 0.1 % EX CREA: 1.0000 "application " | TOPICAL_CREAM | Freq: Every day | CUTANEOUS | 2 refills | Status: AC

## 2021-06-24 MED ORDER — ATORVASTATIN CALCIUM 40 MG PO TABS: 40.0000 mg | ORAL_TABLET | Freq: Every day | ORAL | 5 refills | Status: AC

## 2021-06-24 MED ORDER — DOCUSATE SODIUM 100 MG PO CAPS: 100.0000 mg | ORAL_CAPSULE | Freq: Two times a day (BID) | ORAL | 0 refills | Status: AC | PRN

## 2021-06-24 MED ORDER — MELATONIN 10 MG PO TABS: 10.0000 mg | ORAL_TABLET | Freq: Every evening | ORAL | 0 refills | Status: AC | PRN

## 2021-06-24 MED ORDER — FERROUS SULFATE 325 (65 FE) MG PO TABS: 325.0000 mg | ORAL_TABLET | Freq: Every day | ORAL | 2 refills | Status: AC

## 2021-06-24 NOTE — Patient Instructions (Addendum)
You were seen today in the [facility] for multiple complaints. Labs were collected, results will be available via MyChart or, if abnormal, you will be contacted by clinic staff. You were prescribed medications, please take as directed. Please follow up in 2 mths for reevaluation of elevated B/P.  [facility] Doctors That Accept Medicaid and/or [facility]  [facility] [address]  [place], [place] [address] [phone] [email or web address]   [facility] [address] [place], [place] [address] [phone] [email or web address]   [facility] [place] [address] [place], [place] [address] Located next to [employer] Phone: [phone] [facility], [facility] [address] [place], [place] [address] Located next to [employer] Phone: [phone] [email or web address]   [facility] [address] [place], [place] [address] [phone] [email or web address]   [facility] [address] [place], [place] [address] [phone] [email or web address]   [facility] [address]  [place], [place] [address] [phone] [email or web address]

## 2021-06-24 NOTE — Progress Notes (Signed)
[place], [place]  [address] Phone:  [phone]   Fax:  [phone] Subjective:   Patient ID: Pamela Wilkinson, female    DOB: Mar 13, 1975, 46 y.o.   MRN: [id number]  Chief Complaint  Patient presents with   Follow-up    6 month follow up; hair loss and hyperlipidemia Patient states that she has a bump on her left eye x 5 months but started off small but has became a little bigger and noticeable. Patient denies pain and vision changes. Patient is also still have irregular periods and feeling very fatigue.   HPI Pamela Wilkinson 46 y.o. female  has a past medical history of Hyperlipidemia (02/2020), Situational anxiety, and Vitamin D deficiency (07/2018). To the [facility] for 6 mth follow up.  Concerned today about swelling to the left eyelid x 5 mths that has increased in size. Denies any pain.   Also endorses continued irregular periods and increased fatigue. LMP 2 wks ago, but at times she does not have a cycle for two months. States that her mother went through menopause at 55 y/o.   Verbalizes also having hot flashes, vaginal dryness and intermittent insomnia. Denies any other concerns today. Denies any  chest pain, shortness of breath, HA or dizziness. Denies any blurred vision, numbness or tingling.   Past Medical History:  Diagnosis Date   Hyperlipidemia 02/2020   Situational anxiety    Vitamin D deficiency 07/2018    History reviewed. No pertinent surgical history.  Family History  Problem Relation Age of Onset   Hypertension Mother    Heart disease Maternal Aunt     Social History   Socioeconomic History   Marital status: Married    Spouse name: Not on file   Number of children: Not on file   Years of education: Not on file   Highest education level: Not on file  Occupational History   Not on file  Tobacco Use   Smoking status: Never   Smokeless tobacco: Never  Vaping Use   Vaping  Use: Never used  Substance and Sexual Activity   Alcohol use: Yes    Comment: occassionally   Drug use: No   Sexual activity: Yes    Birth control/protection: None  Other Topics Concern   Not on file  Social History Narrative   Not on file   Social Determinants of Health   Financial Resource Strain: Not on file  Food Insecurity: Not on file  Transportation Needs: Not on file  Physical Activity: Not on file  Stress: Not on file  Social Connections: Not on file  Intimate Partner Violence: Not on file    Outpatient Medications Prior to Visit  Medication Sig Dispense Refill   docusate sodium (COLACE) 100 MG capsule Take 1 capsule (100 mg total) by mouth 2 (two) times daily as needed for mild constipation or moderate constipation. 30 capsule 0   atorvastatin (LIPITOR) 40 MG tablet Take 1 tablet (40 mg total) by mouth daily. 30 tablet 5   ferrous sulfate 325 (65 FE) MG tablet Take 1 tablet (325 mg total) by mouth daily. 30 tablet 2   Probiotic Product (PROBIOTIC PO) Take by mouth. (Patient not taking: Reported on 12/17/2020)     No facility-administered medications prior to visit.    No Known Allergies  Review of Systems  Constitutional:  Negative for chills, fever and malaise/fatigue.  Eyes:        Left  Eyelid swelling, no erythema, non tender to palpation    Respiratory:  Negative for cough and shortness of breath.   Cardiovascular:  Negative for chest pain, palpitations and leg swelling.  Gastrointestinal:  Negative for abdominal pain, blood in stool, constipation, diarrhea, nausea and vomiting.  Genitourinary:        See HPI  Skin: Negative.   Neurological: Negative.   Psychiatric/Behavioral:  Negative for depression. The patient has insomnia. The patient is not nervous/anxious.   All other systems reviewed and are negative.      Objective:    Physical Exam Vitals reviewed.  Constitutional:      General: She is not in acute distress.    Appearance: Normal  appearance. She is normal weight.  HENT:     Head: Normocephalic.  Cardiovascular:     Rate and Rhythm: Normal rate and regular rhythm.     Pulses: Normal pulses.     Heart sounds: Normal heart sounds.     Comments: No obvious peripheral edema Pulmonary:     Effort: Pulmonary effort is normal.     Breath sounds: Normal breath sounds.  Skin:    General: Skin is warm and dry.     Capillary Refill: Capillary refill takes less than 2 seconds.  Neurological:     Mental Status: She is alert.  Psychiatric:        Mood and Affect: Mood normal.        Behavior: Behavior normal.        Thought Content: Thought content normal.        Judgment: Judgment normal.     BP (!) 149/89   Pulse 66   Temp 98.8 F (37.1 C)   Ht '5\' 1"'  (1.549 m)   Wt 124 lb 12.8 oz (56.6 kg)   SpO2 98%   BMI 23.58 kg/m  Wt Readings from Last 3 Encounters:  06/24/21 124 lb 12.8 oz (56.6 kg)  12/17/20 125 lb 8 oz (56.9 kg)  10/17/20 128 lb 0.6 oz (58.1 kg)    Immunization History  Administered Date(s) Administered   Influenza,inj,Quad PF,6+ Mos 02/06/2013, 12/22/2015, 11/01/2017   PFIZER(Purple Top)SARS-COV-2 Vaccination 05/29/2019, 06/20/2019   Tdap 06/21/2018    Diabetic Foot Exam - Simple   No data filed     Lab Results  Component Value Date   TSH 2.460 12/17/2020   Lab Results  Component Value Date   WBC 3.9 06/24/2021   HGB 13.6 06/24/2021   HCT 40.6 06/24/2021   MCV 90 06/24/2021   PLT 266 06/24/2021   Lab Results  Component Value Date   NA 141 06/24/2021   K 5.0 06/24/2021   CO2 22 06/24/2021   GLUCOSE 82 06/24/2021   BUN 12 06/24/2021   CREATININE 0.73 06/24/2021   BILITOT 0.4 06/24/2021   ALKPHOS 81 06/24/2021   AST 17 06/24/2021   ALT 14 06/24/2021   PROT 7.8 06/24/2021   ALBUMIN 5.1 (H) 06/24/2021   CALCIUM 10.0 06/24/2021   ANIONGAP 9 01/19/2016   EGFR 103 06/24/2021   Lab Results  Component Value Date   CHOL 219 (H) 12/17/2020   CHOL 217 (H) 09/05/2020   CHOL  231 (H) 02/26/2020   Lab Results  Component Value Date   HDL 69 12/17/2020   HDL 75 09/05/2020   HDL 73 02/26/2020   Lab Results  Component Value Date   LDLCALC 131 (H) 12/17/2020   LDLCALC 125 (H) 09/05/2020   LDLCALC 144 (H) 02/26/2020  Lab Results  Component Value Date   TRIG 108 12/17/2020   TRIG 96 09/05/2020   TRIG 80 02/26/2020   Lab Results  Component Value Date   CHOLHDL 3.2 12/17/2020   CHOLHDL 2.9 09/05/2020   CHOLHDL 3.2 02/26/2020   Lab Results  Component Value Date   HGBA1C 5.1 06/24/2021   HGBA1C 4.8 11/01/2017   HGBA1C 4.8 10/28/2015       Assessment & Plan:   Problem List Items Addressed This Visit       Other   Anemia   Relevant Medications   ferrous sulfate 325 (65 FE) MG tablet   Other Visit Diagnoses     Other fatigue    -  Primary   Relevant Orders   CBC with Differential/Platelet (Completed)   Comprehensive metabolic panel (Completed)   Vitamin D, 25-hydroxy (Completed)   Vitamin B12 (Completed)   Estrogens, Total (Completed)   Iron, TIBC and Ferritin Panel (Completed) Discussed possible causes Discussed non pharmacological methods for management of symptoms Informed to take OTC medications as needed    Other hyperlipidemia       Relevant Medications   atorvastatin (LIPITOR) 40 MG tablet   Medication side effect       Relevant Medications   docusate sodium (COLACE) 100 MG capsule   Weakness       Relevant Orders   CBC with Differential/Platelet (Completed)   Comprehensive metabolic panel (Completed)   Vitamin D, 25-hydroxy (Completed)   Vitamin B12 (Completed)   Estrogens, Total (Completed)   Iron, TIBC and Ferritin Panel (Completed) Discussed possible causes Discussed non pharmacological methods for management of symptoms Informed to take OTC medications as needed    Swelling of gland of left eyelid       Prediabetes       Relevant Orders   Hemoglobin A1c (Completed)   Other insomnia       Relevant Medications    Melatonin 10 MG TABS   Elevated BP without diagnosis of hypertension       Follow up in 2 mths for reevaluation of elevated B/P, sooner as needed     I am having [place] start on Melatonin. I am also having her maintain her atorvastatin, ferrous sulfate, and docusate sodium.  Meds ordered this encounter  Medications   atorvastatin (LIPITOR) 40 MG tablet    Sig: Take 1 tablet (40 mg total) by mouth daily.    Dispense:  30 tablet    Refill:  5   ferrous sulfate 325 (65 FE) MG tablet    Sig: Take 1 tablet (325 mg total) by mouth daily.    Dispense:  30 tablet    Refill:  2   docusate sodium (COLACE) 100 MG capsule    Sig: Take 1 capsule (100 mg total) by mouth 2 (two) times daily as needed for mild constipation or moderate constipation.    Dispense:  30 capsule    Refill:  0   Melatonin 10 MG TABS    Sig: Take 10 mg by mouth at bedtime as needed.    Dispense:  30 tablet    Refill:  0     Teena Dunk, NP

## 2021-06-24 NOTE — Progress Notes (Signed)
   New Patient   Subjective  Pamela Wilkinson is a 46 y.o. female who presents for the following: New Patient (Initial Visit) (Patient here today with her interpreter for a lesion that she noticed behind her right ear x 4-5 months ago that's itching and growing. Per patient here husband did try to pop the bump and he noticed white stuff come from it. No pain, no bleeding, no treatment. ).   The following portions of the chart were reviewed this encounter and updated as appropriate:  Tobacco  Allergies  Meds  Problems  Med Hx  Surg Hx  Fam Hx      Objective  Well appearing patient in no apparent distress; mood and affect are within normal limits.  All skin waist up examined.  Right Occipital Scalp White hyperkeratotic plaque with follicular hyperplasia        Assessment & Plan  Seborrheic keratosis Right Occipital Scalp  Benign okay to leave if stable. Call for appointment if it changes.    triamcinolone cream (KENALOG) 0.1 % - Right Occipital Scalp Apply 1 application  topically daily.     I, Gladine Plude, PA-C, have reviewed all documentation's for this visit.  The documentation on 06/24/21 for the exam, diagnosis, procedures and orders are all accurate and complete.

## 2021-06-26 ENCOUNTER — Other Ambulatory Visit: Payer: Self-pay

## 2021-06-26 ENCOUNTER — Other Ambulatory Visit: Payer: Self-pay | Admitting: Nurse Practitioner

## 2021-06-26 DIAGNOSIS — E559 Vitamin D deficiency, unspecified: Secondary | ICD-10-CM

## 2021-06-26 MED ORDER — VITAMIN D (ERGOCALCIFEROL) 1.25 MG (50000 UNIT) PO CAPS
50000.0000 [IU] | ORAL_CAPSULE | ORAL | 0 refills | Status: AC
  Filled 2021-06-26: qty 4, 28d supply, fill #0

## 2021-06-29 LAB — COMPREHENSIVE METABOLIC PANEL
ALT: 14 IU/L (ref 0–32)
AST: 17 IU/L (ref 0–40)
Albumin/Globulin Ratio: 1.9 (ref 1.2–2.2)
Albumin: 5.1 g/dL — ABNORMAL HIGH (ref 3.8–4.8)
Alkaline Phosphatase: 81 IU/L (ref 44–121)
BUN/Creatinine Ratio: 16 (ref 9–23)
BUN: 12 mg/dL (ref 6–24)
Bilirubin Total: 0.4 mg/dL (ref 0.0–1.2)
CO2: 22 mmol/L (ref 20–29)
Calcium: 10 mg/dL (ref 8.7–10.2)
Chloride: 103 mmol/L (ref 96–106)
Creatinine, Ser: 0.73 mg/dL (ref 0.57–1.00)
Globulin, Total: 2.7 g/dL (ref 1.5–4.5)
Glucose: 82 mg/dL (ref 70–99)
Potassium: 5 mmol/L (ref 3.5–5.2)
Sodium: 141 mmol/L (ref 134–144)
Total Protein: 7.8 g/dL (ref 6.0–8.5)
eGFR: 103 mL/min/{1.73_m2} (ref >59)

## 2021-06-29 LAB — CBC WITH DIFFERENTIAL/PLATELET
Basophils Absolute: 0 10*3/uL (ref 0.0–0.2)
Basos: 1 %
EOS (ABSOLUTE): 0.1 10*3/uL (ref 0.0–0.4)
Eos: 3 %
Hematocrit: 40.6 % (ref 34.0–46.6)
Hemoglobin: 13.6 g/dL (ref 11.1–15.9)
Immature Grans (Abs): 0 10*3/uL (ref 0.0–0.1)
Immature Granulocytes: 0 %
Lymphocytes Absolute: 1.6 10*3/uL (ref 0.7–3.1)
Lymphs: 41 %
MCH: 30.2 pg (ref 26.6–33.0)
MCHC: 33.5 g/dL (ref 31.5–35.7)
MCV: 90 fL (ref 79–97)
Monocytes Absolute: 0.3 10*3/uL (ref 0.1–0.9)
Monocytes: 8 %
Neutrophils Absolute: 1.8 10*3/uL (ref 1.4–7.0)
Neutrophils: 47 %
Platelets: 266 10*3/uL (ref 150–450)
RBC: 4.5 x10E6/uL (ref 3.77–5.28)
RDW: 13.7 % (ref 11.7–15.4)
WBC: 3.9 10*3/uL (ref 3.4–10.8)

## 2021-06-29 LAB — IRON,TIBC AND FERRITIN PANEL
Ferritin: 23 ng/mL (ref 15–150)
Iron Saturation: 29 % (ref 15–55)
Iron: 120 ug/dL (ref 27–159)
Total Iron Binding Capacity: 413 ug/dL (ref 250–450)
UIBC: 293 ug/dL (ref 131–425)

## 2021-06-29 LAB — ESTROGENS, TOTAL: Estrogen: 163 pg/mL

## 2021-06-29 LAB — VITAMIN D 25 HYDROXY (VIT D DEFICIENCY, FRACTURES): Vit D, 25-Hydroxy: 18.9 ng/mL — ABNORMAL LOW (ref 30.0–100.0)

## 2021-06-29 LAB — VITAMIN B12: Vitamin B-12: 572 pg/mL (ref 232–1245)

## 2021-06-29 LAB — HEMOGLOBIN A1C
Est. average glucose Bld gHb Est-mCnc: 100 mg/dL
Hgb A1c MFr Bld: 5.1 % (ref 4.8–5.6)

## 2021-07-03 ENCOUNTER — Other Ambulatory Visit: Payer: Self-pay

## 2021-08-03 ENCOUNTER — Ambulatory Visit: Payer: Self-pay | Admitting: Nurse Practitioner

## 2021-08-20 ENCOUNTER — Encounter: Payer: Self-pay | Admitting: Nurse Practitioner

## 2021-08-20 ENCOUNTER — Ambulatory Visit (INDEPENDENT_AMBULATORY_CARE_PROVIDER_SITE_OTHER): Payer: Self-pay | Admitting: Nurse Practitioner

## 2021-08-20 VITALS — BP 123/63 | HR 72 | Temp 98.2°F | Ht 61.0 in

## 2021-08-20 DIAGNOSIS — Z124 Encounter for screening for malignant neoplasm of cervix: Secondary | ICD-10-CM

## 2021-08-20 NOTE — Progress Notes (Signed)
@Patient  ID: , female    DOB: [birth year], 46 y.o.   MRN: [id number]  Chief Complaint  Patient presents with   Gynecologic Exam    Referring provider: [id number] I, NP   HPI  Patient presents today for Pap smear.  She states that she has been having abnormal menstrual cycles.  She denies any count of discharge or issues today. Denies f/c/s, n/v/d, hemoptysis, PND, leg swelling Denies chest pain or edema     No Known Allergies  Immunization History  Administered Date(s) Administered   Influenza,inj,Quad PF,6+ Mos 02/06/2013, 12/22/2015, 11/01/2017   PFIZER(Purple Top)SARS-COV-2 Vaccination 05/29/2019, 06/20/2019   Tdap 06/21/2018    Past Medical History:  Diagnosis Date   Hyperlipidemia 02/2020   Situational anxiety    Vitamin D deficiency 07/2018    Tobacco History: Social History   Tobacco Use  Smoking Status Never  Smokeless Tobacco Never   Counseling given: Not Answered   Outpatient Encounter Medications as of 08/20/2021  Medication Sig   atorvastatin (LIPITOR) 40 MG tablet Take 1 tablet (40 mg total) by mouth daily.   docusate sodium (COLACE) 100 MG capsule Take 1 capsule (100 mg total) by mouth 2 (two) times daily as needed for mild constipation or moderate constipation.   ferrous sulfate 325 (65 FE) MG tablet Take 1 tablet (325 mg total) by mouth daily.   Melatonin 10 MG TABS Take 10 mg by mouth at bedtime as needed.   Vitamin D, Ergocalciferol, (DRISDOL) 1.25 MG (50000 UNIT) CAPS capsule Take 1 capsule (50,000 Units total) by mouth once every 7 (seven) days for 8 doses.   triamcinolone cream (KENALOG) 0.1 % Apply 1 application  topically daily. (Patient not taking: Reported on 08/20/2021)   No facility-administered encounter medications on file as of 08/20/2021.     Review of Systems  Review of Systems  Constitutional: Negative.   HENT: Negative.    Cardiovascular: Negative.   Gastrointestinal: Negative.    Allergic/Immunologic: Negative.   Neurological: Negative.   Psychiatric/Behavioral: Negative.         Physical Exam  BP 123/63 (BP Location: Right Arm, Patient Position: Sitting, Cuff Size: Normal)   Pulse 72   Temp 98.2 F (36.8 C)   Ht 5\' 1"  (1.549 m)   SpO2 (!) 74%   BMI 23.58 kg/m   Wt Readings from Last 5 Encounters:  06/24/21 124 lb 12.8 oz (56.6 kg)  12/17/20 125 lb 8 oz (56.9 kg)  10/17/20 128 lb 0.6 oz (58.1 kg)  09/05/20 126 lb (57.2 kg)  02/26/20 122 lb (55.3 kg)     Physical Exam Vitals and nursing note reviewed. Exam conducted with a chaperone present.  Constitutional:      General: She is not in acute distress.    Appearance: She is well-developed.  Cardiovascular:     Rate and Rhythm: Normal rate and regular rhythm.  Pulmonary:     Effort: Pulmonary effort is normal.     Breath sounds: Normal breath sounds.  Genitourinary:    General: Normal vulva.     Vagina: Normal.     Cervix: Friability present.     Uterus: Normal.      Adnexa: Right adnexa normal and left adnexa normal.  Neurological:     Mental Status: She is alert and oriented to person, place, and time.      Lab Results:  CBC    Component Value Date/Time   WBC 3.9 06/24/2021 1105   WBC 11.0 (H)  01/19/2016 2005   RBC 4.50 06/24/2021 1105   RBC 3.91 01/19/2016 2005   HGB 13.6 06/24/2021 1105   HCT 40.6 06/24/2021 1105   PLT 266 06/24/2021 1105   MCV 90 06/24/2021 1105   MCH 30.2 06/24/2021 1105   MCH 30.7 01/19/2016 2005   MCHC 33.5 06/24/2021 1105   MCHC 34.8 01/19/2016 2005   RDW 13.7 06/24/2021 1105   LYMPHSABS 1.6 06/24/2021 1105   MONOABS 0.5 01/19/2016 2005   EOSABS 0.1 06/24/2021 1105   BASOSABS 0.0 06/24/2021 1105    BMET    Component Value Date/Time   NA 141 06/24/2021 1105   K 5.0 06/24/2021 1105   CL 103 06/24/2021 1105   CO2 22 06/24/2021 1105   GLUCOSE 82 06/24/2021 1105   GLUCOSE 124 (H) 01/19/2016 2005   BUN 12 06/24/2021 1105   CREATININE 0.73  06/24/2021 1105   CREATININE 0.75 10/28/2015 1536   CALCIUM 10.0 06/24/2021 1105   GFRNONAA 106 02/26/2020 0944   GFRNONAA >89 10/28/2015 1536   GFRAA 122 02/26/2020 0944   GFRAA >89 10/28/2015 1536    BNP No results found for: "BNP"  ProBNP No results found for: "PROBNP"  Imaging: No results found.   Assessment & Plan:   Cervical cancer screening - Pap IG, Ct-Ng TV HSV 1/2 NAA   Follow up:  Follow up as scheduled     Ivonne Andrew, NP 08/21/2021

## 2021-08-21 ENCOUNTER — Encounter: Payer: Self-pay | Admitting: Nurse Practitioner

## 2021-08-21 DIAGNOSIS — Z124 Encounter for screening for malignant neoplasm of cervix: Secondary | ICD-10-CM | POA: Insufficient documentation

## 2021-08-21 NOTE — Patient Instructions (Signed)
1. Cervical cancer screening  - Pap IG, Ct-Ng TV HSV 1/2 NAA   Follow up:  Follow up as scheduled   Pap Test Why am I having this test? A Pap test, also called a Pap smear, is a screening test to check for signs of: Infection. Cancer of the cervix. The cervix is the lower part of the uterus that opens into the vagina. Changes that may be a sign that cancer is developing (precancerous changes). Women need this test on a regular basis. In general, you should have a Pap test every 3 years until you reach menopause or age 65. Women aged 30-60 may choose to have their Pap test done at the same time as an HPV (human papillomavirus) test every 5 years (instead of every 3 years). Your health care provider may recommend having Pap tests more or less often depending on your medical conditions and past Pap test results. What is being tested? Cervical cells are tested for signs of infection or abnormalities. What kind of sample is taken?  Your health care provider will collect a sample of cells from the surface of your cervix. This will be done using a small cotton swab, plastic spatula, or brush that is inserted into your vagina using a tool called a speculum. This sample is often collected during a pelvic exam, when you are lying on your back on an exam table with your feet in footrests (stirrups). In some cases, fluids (secretions) from the cervix or vagina may also be collected. How do I prepare for this test? Be aware of where you are in your menstrual cycle. If you are menstruating on the day of the test, you may be asked to reschedule. You may need to reschedule if you have a known vaginal infection on the day of the test. Follow instructions from your health care provider about: Changing or stopping your regular medicines. Some medicines can cause abnormal test results, such as vaginal medicines and tetracycline. Avoiding douching 2-3 days before or the day of the test. Tell a health care  provider about: Any allergies you have. All medicines you are taking, including vitamins, herbs, eye drops, creams, and over-the-counter medicines. Any bleeding problems you have. Any surgeries you have had. Any medical conditions you have. Whether you are pregnant or may be pregnant. How are the results reported? Your test results will be reported as either abnormal or normal. What do the results mean? A normal test result means that you do not have signs of cancer of the cervix. An abnormal result may mean that you have: Cancer. A Pap test by itself is not enough to diagnose cancer. You will have more tests done if cancer is suspected. Precancerous changes in your cervix. Inflammation of the cervix. An STI (sexually transmitted infection). A fungal infection. A parasite infection. Talk with your health care provider about what your results mean. In some cases, your health care provider may do more testing to confirm the results. Questions to ask your health care provider Ask your health care provider, or the department that is doing the test: When will my results be ready? How will I get my results? What are my treatment options? What other tests do I need? What are my next steps? Summary In general, women should have a Pap test every 3 years until they reach menopause or age 61. Your health care provider will collect a sample of cells from the surface of your cervix. This will be done using a small  cotton swab, plastic spatula, or brush. In some cases, fluids (secretions) from the cervix or vagina may also be collected. This information is not intended to replace advice given to you by your health care provider. Make sure you discuss any questions you have with your health care provider. Document Revised: 03/28/2020 Document Reviewed: 03/28/2020 Elsevier Patient Education  2023 [employer].

## 2021-08-21 NOTE — Assessment & Plan Note (Signed)
-   Pap IG, Ct-Ng TV HSV 1/2 NAA   Follow up:  Follow up as scheduled

## 2021-08-25 LAB — PAP IG, CT-NG TV HSV 1/2 NAA
Chlamydia, Nuc. Acid Amp: NEGATIVE
Gonococcus, Nuc. Acid Amp: NEGATIVE
HSV 1 NAA: NEGATIVE
HSV 2 NAA: NEGATIVE
Trich vag by NAA: NEGATIVE

## 2022-02-22 ENCOUNTER — Ambulatory Visit: Payer: Self-pay | Admitting: Nurse Practitioner

## 2022-03-24 ENCOUNTER — Encounter (HOSPITAL_BASED_OUTPATIENT_CLINIC_OR_DEPARTMENT_OTHER): Payer: Self-pay | Admitting: Emergency Medicine

## 2022-03-24 ENCOUNTER — Emergency Department (HOSPITAL_BASED_OUTPATIENT_CLINIC_OR_DEPARTMENT_OTHER)
Admission: EM | Admit: 2022-03-24 | Discharge: 2022-03-24 | Disposition: A | Discharge status: Home / Self Care | Payer: No Typology Code available for payment source | Attending: Emergency Medicine | Admitting: Emergency Medicine

## 2022-03-24 ENCOUNTER — Emergency Department (HOSPITAL_BASED_OUTPATIENT_CLINIC_OR_DEPARTMENT_OTHER): Payer: Self-pay

## 2022-03-24 DIAGNOSIS — W1839XA Other fall on same level, initial encounter: Secondary | ICD-10-CM | POA: Insufficient documentation

## 2022-03-24 DIAGNOSIS — S0990XA Unspecified injury of head, initial encounter: Secondary | ICD-10-CM | POA: Insufficient documentation

## 2022-03-24 DIAGNOSIS — Y92019 Unspecified place in single-family (private) house as the place of occurrence of the external cause: Secondary | ICD-10-CM | POA: Diagnosis not present

## 2022-03-24 DIAGNOSIS — S161XXA Strain of muscle, fascia and tendon at neck level, initial encounter: Secondary | ICD-10-CM | POA: Insufficient documentation

## 2022-03-24 DIAGNOSIS — Y93E2 Activity, laundry: Secondary | ICD-10-CM | POA: Diagnosis not present

## 2022-03-24 MED ORDER — METOCLOPRAMIDE HCL 10 MG PO TABS
10.0000 mg | ORAL_TABLET | Freq: Once | ORAL | Status: AC
  Administered 2022-03-24: 10 mg via ORAL
  Filled 2022-03-24: qty 1

## 2022-03-24 MED ORDER — CYCLOBENZAPRINE HCL 10 MG PO TABS: 10.0000 mg | ORAL_TABLET | Freq: Three times a day (TID) | ORAL | 0 refills | Status: AC | PRN

## 2022-03-24 MED ORDER — ACETAMINOPHEN 500 MG PO TABS
1000.0000 mg | ORAL_TABLET | Freq: Four times a day (QID) | ORAL | Status: DC | PRN
  Administered 2022-03-24: 1000 mg via ORAL
  Filled 2022-03-24: qty 2

## 2022-03-24 MED ORDER — DEXAMETHASONE 4 MG PO TABS
4.0000 mg | ORAL_TABLET | Freq: Once | ORAL | Status: AC
  Administered 2022-03-24: 4 mg via ORAL
  Filled 2022-03-24: qty 1

## 2022-03-24 NOTE — ED Notes (Signed)
Reviewed discharge instructions , recommendations and medications with pt. Pt states understanding. Ambulatory at discharge 

## 2022-03-24 NOTE — Discharge Instructions (Addendum)
The CT scan of your head and neck were negative. Your symptoms are likely related to your head injury and a muscle strain of your neck. I am prescribing muscle relaxers for you to take as needed over the next few days to help your symptoms. You may also take over the counter Tylenol as needed. You may take up to '1000mg'$  Tylenol every 6 hours. Do not take more than '4000mg'$  in 24 hours. If your symptoms do not resolve, you should follow up with your PCP for re-evaluation. If you do not have a PCP, I have provided information for 2 primary care clinics you may contact to schedule an appointment as needed.   In addition to the above medications, it is important to stay active to prevent further tightening of your muscles which can worsen your pain. I have provided some neck exercises you can complete at home as tolerated. You may also benefit from using a heating pad.  If you have any new injury or develop worsening symptoms such as severe headache or neck pain, vision problems, fever, frequent vomiting, or other concerns, please return to nearest emergency department for re-evaluation.   La tomografa computarizada de su cabeza y cuello fue negativa. Es probable que sus sntomas estn relacionados con su lesin en la cabeza y Ardelia Mems distensin muscular en el cuello. Le estoy recetando relajantes musculares para que los tome segn sea necesario durante los prximos das para aliviar sus sntomas. Tambin puede tomar Tylenol de venta libre segn sea necesario. Puede tomar hasta 1000 mg de Tylenol cada 6 horas. No tomar ms de 4000 mg en 24 horas. Si sus sntomas no desaparecen, debe hacer un seguimiento con su PCP para una nueva evaluacin. Si no tiene un PCP, le proporcion informacin de 2 clnicas de atencin primaria con las que puede comunicarse para programar una cita segn sea necesario.  Adems de los [employer], es importante mantenerse activo para Product/process development scientist una mayor tensin de los msculos que  puede Occupational hygienist. Le he proporcionado algunos ejercicios para el cuello que Optometrist en casa segn su tolerancia. Tambin puede resultar beneficioso utilizar una almohadilla trmica.  Si tiene [place] lesin nueva o desarrolla sntomas que empeoran, como dolor de cabeza intenso o dolor de cuello, problemas de visin, fiebre, vmitos frecuentes u otras inquietudes, regrese al departamento de emergencias ms cercano para una nueva evaluacin.

## 2022-03-24 NOTE — ED Notes (Signed)
C Collar removed per provider

## 2022-03-24 NOTE — ED Triage Notes (Addendum)
Pt reports using a machine at work and the machine jerked her forward which resulted in her hitting her head on the machine. Denies loc. Denies blood thinners. Pt c/o posterior neck pain and right sided head pain. Movement worsens pain. Pt placed in c-collar in triage.

## 2022-03-24 NOTE — ED Notes (Signed)
Patient transported to CT 

## 2022-03-24 NOTE — ED Provider Notes (Signed)
[facility] Provider Note   CSN: [id number] Arrival date & time: 03/24/22  1541   Interpretor id, [id number]  History  Chief Complaint  Patient presents with   Pamela Wilkinson is a 47 y.o. female with no PMH who presents to ED following injury at work. Pt states she was standing on ground level when machine she was working with had a pallet that got stuck to her shirt and thus pushed her to the ground and she injured her head and right neck. Associated nausea since then. She was able to ambulate following the injury. She denies loss of consciousness, vision changes, vomiting, chest pain, shortness of breath, or other symptoms at this time. She denies previous head injuries or chronic headaches or neck pain. She has not had any medications for her symptoms since they started. She does not take anticoagulants.       Home Medications No prescription medications  Allergies    Naproxen sodium    Review of Systems   Review of Systems  All other systems reviewed and are negative.   Physical Exam Updated Vital Signs BP (!) 161/90 (BP Location: Left Arm)   Pulse 73   Temp 98 F (36.7 C)   Resp 20   Ht '5\' 2"'$  (1.575 m)   Wt 56.7 kg   SpO2 100%   BMI 22.86 kg/m  Physical Exam Vitals and nursing note reviewed.  Constitutional:      General: She is not in acute distress.    Appearance: Normal appearance. She is not ill-appearing.  HENT:     Head: Normocephalic and atraumatic.     Mouth/Throat:     Mouth: Mucous membranes are moist.  Eyes:     Extraocular Movements: Extraocular movements intact.     Conjunctiva/sclera: Conjunctivae normal.     Pupils: Pupils are equal, round, and reactive to light.  Neck:     Comments: C-collar placed in triage intact, pt has tenderness over right sternocleidomastoid and trapezius muscles, no midline cervical spine tenderness, stepoffs, or deformities Cardiovascular:     Rate  and Rhythm: Normal rate and regular rhythm.     Heart sounds: No murmur heard. Pulmonary:     Effort: Pulmonary effort is normal.     Breath sounds: Normal breath sounds.  Abdominal:     General: Abdomen is flat.     Palpations: Abdomen is soft.     Tenderness: There is no abdominal tenderness. There is no guarding or rebound.  Musculoskeletal:     Right lower leg: No edema.     Left lower leg: No edema.     Comments: No midline thoracic or lumbar spinal tenderness, stepoffs, or deformities, moving all 4 extremities spontaneously and equally, stable and steady gait  Skin:    General: Skin is warm and dry.     Capillary Refill: Capillary refill takes less than 2 seconds.  Neurological:     Mental Status: She is alert and oriented to person, place, and time.     GCS: GCS eye subscore is 4. GCS verbal subscore is 5. GCS motor subscore is 6.     Cranial Nerves: Cranial nerves 2-12 are intact. No cranial nerve deficit, dysarthria or facial asymmetry.     Motor: Motor function is intact. No weakness, tremor, atrophy or abnormal muscle tone.     Coordination: Coordination is intact.     Gait: Gait is intact.  Psychiatric:  Speech: Speech normal.        Behavior: Behavior normal. Behavior is cooperative.        Thought Content: Thought content normal.     ED Results / Procedures / Treatments   Labs (all labs ordered are listed, but only abnormal results are displayed) Labs Reviewed - No data to display  EKG None  Radiology CT Cervical Spine Wo Contrast  Result Date: 03/24/2022 CLINICAL DATA:  Fall. EXAM: CT CERVICAL SPINE WITHOUT CONTRAST TECHNIQUE: Multidetector CT imaging of the cervical spine was performed without intravenous contrast. Multiplanar CT image reconstructions were also generated. RADIATION DOSE REDUCTION: This exam was performed according to the departmental dose-optimization program which includes automated exposure control, adjustment of the mA and/or kV  according to patient size and/or use of iterative reconstruction technique. COMPARISON:  None Available. FINDINGS: Alignment: Mild reversal of the normal cervical lordosis. No traumatic malalignment. Skull base and vertebrae: No acute fracture. No primary bone lesion or focal pathologic process. Soft tissues and spinal canal: No prevertebral fluid or swelling. No visible canal hematoma. Disc levels:  Normal. Upper chest: Negative. Other: None. IMPRESSION: 1. No acute cervical spine fracture or traumatic malalignment. Electronically Signed   By: Titus Dubin M.D.   On: 03/24/2022 17:16   CT Head Wo Contrast  Result Date: 03/24/2022 CLINICAL DATA:  Fall, trauma. EXAM: CT HEAD WITHOUT CONTRAST TECHNIQUE: Contiguous axial images were obtained from the base of the skull through the vertex without intravenous contrast. RADIATION DOSE REDUCTION: This exam was performed according to the departmental dose-optimization program which includes automated exposure control, adjustment of the mA and/or kV according to patient size and/or use of iterative reconstruction technique. COMPARISON:  Head CT 02/27/2005 FINDINGS: Brain: No evidence of acute infarction, hemorrhage, hydrocephalus, extra-axial collection or mass lesion/mass effect. Vascular: No hyperdense vessel or unexpected calcification. Skull: Normal. Negative for fracture or focal lesion. Sinuses/Orbits: No acute finding. There is mucosal thickening of the right frontal sinus. Other: None. IMPRESSION: No acute intracranial abnormality. Electronically Signed   By: Ronney Asters M.D.   On: 03/24/2022 17:12    Procedures Procedures    Medications Ordered in ED Medications  acetaminophen (TYLENOL) tablet 1,000 mg (1,000 mg Oral Given 03/24/22 1637)  metoCLOPramide (REGLAN) tablet 10 mg (10 mg Oral Given 03/24/22 1638)  dexamethasone (DECADRON) tablet 4 mg (4 mg Oral Given 03/24/22 1637)    ED Course/ Medical Decision Making/ A&P                              Medical Decision Making Amount and/or Complexity of Data Reviewed Radiology: ordered. Decision-making details documented in ED Course.  Risk OTC drugs. Prescription drug management.   Medical Decision Making:   Pamela Wilkinson is a 47 y.o. female who presented to the ED today with head and neck injury detailed above.     Complete initial physical exam performed, notably the patient was in no acute distress and neurologically intact.  She had a c-collar in place due to complaint of neck pain.  She had tenderness over the right sternocleidomastoid and trapezius muscles but no midline spinal tenderness, step-offs, or deformities.  She had a stable, steady gait walking extensively down the hallway of the emergency department.  She has equal strength bilaterally.  No wounds, deformities, or other signs of significant traumatic injury. Reviewed and confirmed nursing documentation for past medical history, family history, social history.    Initial Assessment:  With the patient's presentation of head and neck injury, most likely diagnosis is muscle strain. Differential diagnosis includes but is not limited to fracture, dislocation, disc herniation, whiplash injury, ICH, SAH, concussion.  This is most consistent with an acute complicated illness  Initial Plan:  CT brain and cervical spine for further evaluation Medications as above for symptomatic treatment Objective evaluation as reviewed   Initial Study Results:   Radiology:  All images reviewed independently. Agree with radiology report at this time.   CT Cervical Spine Wo Contrast  Result Date: 03/24/2022 CLINICAL DATA:  Fall. EXAM: CT CERVICAL SPINE WITHOUT CONTRAST TECHNIQUE: Multidetector CT imaging of the cervical spine was performed without intravenous contrast. Multiplanar CT image reconstructions were also generated. RADIATION DOSE REDUCTION: This exam was performed according to the departmental dose-optimization  program which includes automated exposure control, adjustment of the mA and/or kV according to patient size and/or use of iterative reconstruction technique. COMPARISON:  None Available. FINDINGS: Alignment: Mild reversal of the normal cervical lordosis. No traumatic malalignment. Skull base and vertebrae: No acute fracture. No primary bone lesion or focal pathologic process. Soft tissues and spinal canal: No prevertebral fluid or swelling. No visible canal hematoma. Disc levels:  Normal. Upper chest: Negative. Other: None. IMPRESSION: 1. No acute cervical spine fracture or traumatic malalignment. Electronically Signed   By: Titus Dubin M.D.   On: 03/24/2022 17:16   CT Head Wo Contrast  Result Date: 03/24/2022 CLINICAL DATA:  Fall, trauma. EXAM: CT HEAD WITHOUT CONTRAST TECHNIQUE: Contiguous axial images were obtained from the base of the skull through the vertex without intravenous contrast. RADIATION DOSE REDUCTION: This exam was performed according to the departmental dose-optimization program which includes automated exposure control, adjustment of the mA and/or kV according to patient size and/or use of iterative reconstruction technique. COMPARISON:  Head CT 02/27/2005 FINDINGS: Brain: No evidence of acute infarction, hemorrhage, hydrocephalus, extra-axial collection or mass lesion/mass effect. Vascular: No hyperdense vessel or unexpected calcification. Skull: Normal. Negative for fracture or focal lesion. Sinuses/Orbits: No acute finding. There is mucosal thickening of the right frontal sinus. Other: None. IMPRESSION: No acute intracranial abnormality. Electronically Signed   By: Ronney Asters M.D.   On: 03/24/2022 17:12     Final Assessment and Plan:   This is a 47 year old Spanish speaking female presenting to the ED complaining of head and neck injury today at work.  She complains of pain to the right side of her head and neck.  She was placed in a c-collar in triage due to the complaint of  neck pain.  She has no midline spinal tenderness, step-offs, or deformities.  She has tenderness over the right sternocleidomastoid and trapezius muscles.  She is neurologically intact with equal strength bilaterally and with normal mentation.  Medications ordered as above for symptomatic treatment.  Due to significant mechanism of injury and patient presenting with work-related injury, CT imaging obtained as above for further assessment.  Imaging was negative.  Patient has reassuring exam, vital signs, and imaging.  Suspect that her symptoms were related to muscle strain of the neck. Doubt significant concussion given reassuring exam. Will provide with muscle relaxers to take at home as needed over next few days for symptomatic treatment, neck exercises, pt advised on other conservative measures to treat symptoms. Given primary care follow up. Strict ED return precautions given, all questions answered, and stable for discharge.    Clinical Impression:  1. Injury of head, initial encounter   2. Cervical strain, acute, initial  encounter      Discharge           Final Clinical Impression(s) / ED Diagnoses Final diagnoses:  Injury of head, initial encounter  Cervical strain, acute, initial encounter    Rx / DC Orders ED Discharge Orders          Ordered    cyclobenzaprine (FLEXERIL) 10 MG tablet  3 times daily PRN        03/24/22 1724              Suzzette Righter, PA-C 03/24/22 1737    Malvin Johns, MD 03/24/22 1944

## 2023-03-14 ENCOUNTER — Encounter (HOSPITAL_COMMUNITY): Payer: Self-pay | Admitting: *Deleted

## 2023-03-14 ENCOUNTER — Ambulatory Visit (HOSPITAL_COMMUNITY)
Admission: EM | Admit: 2023-03-14 | Discharge: 2023-03-14 | Disposition: A | Discharge status: Home / Self Care | Payer: Self-pay | Attending: Family Medicine | Admitting: Family Medicine

## 2023-03-14 DIAGNOSIS — L239 Allergic contact dermatitis, unspecified cause: Secondary | ICD-10-CM

## 2023-03-14 MED ORDER — DEXAMETHASONE SODIUM PHOSPHATE 10 MG/ML IJ SOLN
INTRAMUSCULAR | Status: AC
  Filled 2023-03-14: qty 1

## 2023-03-14 MED ORDER — CEPHALEXIN 250 MG PO CAPS: 250.0000 mg | ORAL_CAPSULE | Freq: Three times a day (TID) | ORAL | 0 refills | Status: AC

## 2023-03-14 MED ORDER — PREDNISONE 20 MG PO TABS: ORAL_TABLET | ORAL | 0 refills | Status: AC

## 2023-03-14 MED ORDER — DEXAMETHASONE SODIUM PHOSPHATE 10 MG/ML IJ SOLN
10.0000 mg | Freq: Once | INTRAMUSCULAR | Status: AC
  Administered 2023-03-14: 10 mg via INTRAMUSCULAR

## 2023-03-14 NOTE — ED Provider Notes (Addendum)
 [facility]    CSN: [id number] Arrival date & time: 03/14/23  1640      History   Chief Complaint No chief complaint on file.   HPI Pamela Wilkinson is a 48 y.o. female.   HPI Here for rash and now blisters.  About 1 week ago after she took some naproxen for sore throat, she began having pruritic rash on her hands and her whole body.  Now in the last couple of days there are areas of more like burns on her hands and trunk and other extremities.  She also has some similar blistering on her upper lip.  No fever or cough.   Past Medical History:  Diagnosis Date   Hyperlipidemia 02/2020   Situational anxiety    Vitamin D deficiency 07/2018    Patient Active Problem List   Diagnosis Date Noted   Cervical cancer screening 08/21/2021   Urinary tract infection with hematuria 07/24/2018   Vaginal discharge 07/24/2018   Chronic intractable headache 07/17/2018   Seasonal allergies 07/17/2018   Bleeding nose 07/17/2018   Bruising 07/17/2018   Anxiety 07/17/2018   Anemia 02/06/2013   Pap smear for cervical cancer screening 11/06/2012   Nipple pain 11/06/2012    History reviewed. No pertinent surgical history.  OB History     Gravida  3   Para      Term      Preterm      AB      Living  3      SAB      IAB      Ectopic      Multiple      Live Births  3            Home Medications    Prior to Admission medications   Medication Sig Start Date End Date Taking? Authorizing Provider  cephALEXin (KEFLEX) 250 MG capsule Take 1 capsule (250 mg total) by mouth 3 (three) times daily for 7 days. 03/14/23 03/21/23 Yes Zenia Resides, MD  predniSONE (DELTASONE) 20 MG tablet 3 tabs daily x3 days, then 2 tabs daily x3 days, then 1 tab daily x3 days, then one half tab daily x3 days, then stop 03/14/23  Yes Garrison Michie, Pamela Aris, MD  atorvastatin (LIPITOR) 40 MG tablet Take 1 tablet (40 mg total) by mouth daily. 06/24/21 12/21/21  Orion Crook I, NP  docusate sodium (COLACE) 100 MG capsule Take 1 capsule (100 mg total) by mouth 2 (two) times daily as needed for mild constipation or moderate constipation. 06/24/21   Passmore, Enid Derry I, NP  ferrous sulfate 325 (65 FE) MG tablet Take 1 tablet (325 mg total) by mouth daily. 06/24/21 09/22/21  Orion Crook I, NP  Melatonin 10 MG TABS Take 10 mg by mouth at bedtime as needed. 06/24/21   Passmore, Enid Derry I, NP  triamcinolone cream (KENALOG) 0.1 % Apply 1 application  topically daily. Patient not taking: Reported on 08/20/2021 06/24/21   Glyn Ade, PA-C  Vitamin D, Ergocalciferol, (DRISDOL) 1.25 MG (50000 UNIT) CAPS capsule Take 1 capsule (50,000 Units total) by mouth once every 7 (seven) days for 8 doses. 06/26/21   Orion Crook I, NP    Family History Family History  Problem Relation Age of Onset   Hypertension Mother    Heart disease Maternal Aunt     Social History Social History   Tobacco Use   Smoking status: Never   Smokeless tobacco: Never  Vaping Use  Vaping status: Never Used  Substance Use Topics   Alcohol use: Yes    Comment: occassionally   Drug use: No     Allergies   Naproxen sodium   Review of Systems Review of Systems   Physical Exam Triage Vital Signs ED Triage Vitals  Encounter Vitals Group     BP 03/14/23 1756 (!) 141/88     Systolic BP Percentile --      Diastolic BP Percentile --      Pulse Rate 03/14/23 1756 71     Resp 03/14/23 1756 18     Temp 03/14/23 1756 98.2 F (36.8 C)     Temp Source 03/14/23 1756 Oral     SpO2 03/14/23 1756 98 %     Weight --      Height --      Head Circumference --      Peak Flow --      Pain Score 03/14/23 1753 0     Pain Loc --      Pain Education --      Exclude from Growth Chart --    No data found.  Updated Vital Signs BP (!) 141/88 (BP Location: Right Arm)   Pulse 71   Temp 98.2 F (36.8 C) (Oral)   Resp 18   SpO2 98%   Visual Acuity Right Eye Distance:   Left Eye  Distance:   Bilateral Distance:    Right Eye Near:   Left Eye Near:    Bilateral Near:     Physical Exam Vitals reviewed.  Constitutional:      General: She is not in acute distress.    Appearance: She is not ill-appearing, toxic-appearing or diaphoretic.  HENT:     Nose: Nose normal.     Mouth/Throat:     Mouth: Mucous membranes are moist.     Comments: There are ulcerations and eschar on her upper lip.  No swelling.  No drainage.  Mucous membranes are moist and pink Eyes:     Extraocular Movements: Extraocular movements intact.     Conjunctiva/sclera: Conjunctivae normal.     Pupils: Pupils are equal, round, and reactive to light.  Cardiovascular:     Rate and Rhythm: Normal rate and regular rhythm.     Heart sounds: No murmur heard. Pulmonary:     Effort: Pulmonary effort is normal. No respiratory distress.     Breath sounds: Normal breath sounds. No stridor. No wheezing, rhonchi or rales.  Musculoskeletal:     Cervical back: Neck supple.  Lymphadenopathy:     Cervical: No cervical adenopathy.  Skin:    Coloration: Skin is not jaundiced or pale.     Comments: No bulla are seen anywhere at this time.  She has some dusky erythematous spots on her hands and chest and trunk and other extremities.  They range in size from about 3 cm in diameter to the 1 on her chest being about 6 x 4 cm.  There is maybe some blistering in the middle of the larger in her chest.  There is no sign of secondary infection.  Neurological:     General: No focal deficit present.     Mental Status: She is alert and oriented to person, place, and time.      UC Treatments / Results  Labs (all labs ordered are listed, but only abnormal results are displayed) Labs Reviewed - No data to display  EKG   Radiology No results found.  Procedures  Procedures (including critical care time)  Medications Ordered in UC Medications  dexamethasone (DECADRON) injection 10 mg (has no administration in time  range)    Initial Impression / Assessment and Plan / UC Course  I have reviewed the triage vital signs and the nursing notes.  Pertinent labs & imaging results that were available during my care of the patient were reviewed by me and considered in my medical decision making (see chart for details).   Visit is conducted in Spanish  Decadron injection is given here and prednisone taper is sent into the pharmacy.  In case there is some bacterial component to this, Keflex is sent in for 5 days.  Request is made to help her find a PCP. Final Clinical Impressions(s) / UC Diagnoses   Final diagnoses:  Allergic dermatitis     Discharge Instructions      You have been given a shot of dexamethasone 10 mg, steroid. (Le han administrado una inyeccin de dexametasona 10 mg, un esteroide.)  Take prednisone 20 mg--3 tabs daily x3 days, then 2 tabs daily x3 days, then 1 tab daily x3 days, then one half tab daily x3 days, then stop. (Tome prednisona 20 mg: 3 comprimidos al da x 3 das, [facility] 2 comprimidos al da x 3 [place], [facility] 1 comprimido al da x 3 [address]  [address], luego media pastilla al da x 3 [place], luego deje de tomarla.)  Take cephalexin 250 mg--1 capsule 3 times daily for 7 days. (Tome cefalexina 250 mg: 1 cpsula 3 veces al da durante [address])       ED Prescriptions     Medication Sig Dispense Auth. Provider   cephALEXin (KEFLEX) 250 MG capsule Take 1 capsule (250 mg total) by mouth 3 (three) times daily for 7 days. 21 capsule Zenia Resides, MD   predniSONE (DELTASONE) 20 MG tablet 3 tabs daily x3 days, then 2 tabs daily x3 days, then 1 tab daily x3 days, then one half tab daily x3 days, then stop 20 tablet Pamela Wilkinson, Pamela Aris, MD      PDMP not reviewed this encounter.   Zenia Resides, MD 03/14/23 Pamela Wilkinson    Zenia Resides, MD 03/14/23 Pamela Wilkinson

## 2023-03-14 NOTE — Discharge Instructions (Addendum)
 You have been given a shot of dexamethasone 10 mg, steroid. (Le han administrado una inyeccin de dexametasona 10 mg, un esteroide.)  Take prednisone 20 mg--3 tabs daily x3 days, then 2 tabs daily x3 days, then 1 tab daily x3 days, then one half tab daily x3 days, then stop. (Tome prednisona 20 mg: 3 comprimidos al da x 3 das, [facility] 2 comprimidos al da x 3 [place], [facility] 1 comprimido al da x 3 [address]  [address], luego media pastilla al da x 3 [place], luego deje de tomarla.)  Take cephalexin 250 mg--1 capsule 3 times daily for 7 days. (Tome cefalexina 250 mg: 1 cpsula 3 veces al da durante [address])

## 2023-03-14 NOTE — ED Triage Notes (Signed)
 Pt states that she took some Naproxen last week when her throat hurt. She states she started having a rash all over her body since taking naproxen. She states rash is itchy.   She states that the rash turned into blisters so she Arnicare cream OTC  She had fever last week and since she has developed blisters on her mouth.

## 2024-03-14 ENCOUNTER — Ambulatory Visit: Payer: Self-pay | Admitting: Internal Medicine
